# Patient Record
Sex: Male | Born: 1965 | Race: White | Hispanic: No | Marital: Married | Smoking: Never smoker
Health system: Southern US, Community
[De-identification: ages and names within clinical notes are randomized; demographics above are authoritative.]

## PROBLEM LIST (undated history)

## (undated) DIAGNOSIS — K429 Umbilical hernia without obstruction or gangrene: Secondary | ICD-10-CM

## (undated) DIAGNOSIS — R7989 Other specified abnormal findings of blood chemistry: Secondary | ICD-10-CM

## (undated) DIAGNOSIS — J309 Allergic rhinitis, unspecified: Secondary | ICD-10-CM

## (undated) DIAGNOSIS — N529 Male erectile dysfunction, unspecified: Secondary | ICD-10-CM

## (undated) DIAGNOSIS — E785 Hyperlipidemia, unspecified: Secondary | ICD-10-CM

## (undated) DIAGNOSIS — F419 Anxiety disorder, unspecified: Secondary | ICD-10-CM

## (undated) HISTORY — PX: VASECTOMY: SHX75

---

## 2004-07-23 ENCOUNTER — Ambulatory Visit: Payer: Self-pay

## 2016-08-05 ENCOUNTER — Encounter: Payer: Self-pay | Admitting: Anesthesiology

## 2016-08-05 ENCOUNTER — Ambulatory Visit
Admission: RE | Admit: 2016-08-05 | Discharge: 2016-08-05 | Disposition: A | Discharge status: Home / Self Care | Payer: BLUE CROSS/BLUE SHIELD | Source: Ambulatory Visit | Attending: Gastroenterology | Admitting: Gastroenterology

## 2016-08-05 ENCOUNTER — Ambulatory Visit: Payer: BLUE CROSS/BLUE SHIELD | Admitting: Anesthesiology

## 2016-08-05 ENCOUNTER — Encounter
Admission: RE | Disposition: A | Discharge status: Home / Self Care | Payer: Self-pay | Source: Ambulatory Visit | Attending: Gastroenterology

## 2016-08-05 DIAGNOSIS — J309 Allergic rhinitis, unspecified: Secondary | ICD-10-CM | POA: Insufficient documentation

## 2016-08-05 DIAGNOSIS — Z7951 Long term (current) use of inhaled steroids: Secondary | ICD-10-CM | POA: Insufficient documentation

## 2016-08-05 DIAGNOSIS — N529 Male erectile dysfunction, unspecified: Secondary | ICD-10-CM | POA: Diagnosis not present

## 2016-08-05 DIAGNOSIS — Z79899 Other long term (current) drug therapy: Secondary | ICD-10-CM | POA: Insufficient documentation

## 2016-08-05 DIAGNOSIS — K640 First degree hemorrhoids: Secondary | ICD-10-CM | POA: Diagnosis not present

## 2016-08-05 DIAGNOSIS — D127 Benign neoplasm of rectosigmoid junction: Secondary | ICD-10-CM | POA: Insufficient documentation

## 2016-08-05 DIAGNOSIS — Z1211 Encounter for screening for malignant neoplasm of colon: Secondary | ICD-10-CM | POA: Insufficient documentation

## 2016-08-05 DIAGNOSIS — Z791 Long term (current) use of non-steroidal anti-inflammatories (NSAID): Secondary | ICD-10-CM | POA: Diagnosis not present

## 2016-08-05 HISTORY — DX: Other specified abnormal findings of blood chemistry: R79.89

## 2016-08-05 HISTORY — PX: COLONOSCOPY WITH PROPOFOL: SHX5780

## 2016-08-05 HISTORY — DX: Hyperlipidemia, unspecified: E78.5

## 2016-08-05 HISTORY — DX: Male erectile dysfunction, unspecified: N52.9

## 2016-08-05 HISTORY — DX: Anxiety disorder, unspecified: F41.9

## 2016-08-05 HISTORY — DX: Allergic rhinitis, unspecified: J30.9

## 2016-08-05 HISTORY — DX: Umbilical hernia without obstruction or gangrene: K42.9

## 2016-08-05 SURGERY — COLONOSCOPY WITH PROPOFOL
Anesthesia: General

## 2016-08-05 MED ORDER — MIDAZOLAM HCL 5 MG/5ML IJ SOLN
INTRAMUSCULAR | Status: DC | PRN
  Administered 2016-08-05: 1 mg via INTRAVENOUS

## 2016-08-05 MED ORDER — PROPOFOL 10 MG/ML IV BOLUS
INTRAVENOUS | Status: DC | PRN
  Administered 2016-08-05: 80 mg via INTRAVENOUS
  Administered 2016-08-05: 40 mg via INTRAVENOUS

## 2016-08-05 MED ORDER — PROPOFOL 500 MG/50ML IV EMUL
INTRAVENOUS | Status: DC | PRN
  Administered 2016-08-05: 160 ug/kg/min via INTRAVENOUS

## 2016-08-05 MED ORDER — PHENYLEPHRINE HCL 10 MG/ML IJ SOLN
INTRAMUSCULAR | Status: DC | PRN
  Administered 2016-08-05 (×4): 100 ug via INTRAVENOUS

## 2016-08-05 MED ORDER — SODIUM CHLORIDE 0.9 % IV SOLN: INTRAVENOUS | Status: DC

## 2016-08-05 MED ORDER — EPHEDRINE SULFATE 50 MG/ML IJ SOLN
INTRAMUSCULAR | Status: DC | PRN
  Administered 2016-08-05: 10 mg via INTRAVENOUS

## 2016-08-05 MED ORDER — SODIUM CHLORIDE 0.9 % IV SOLN
INTRAVENOUS | Status: DC
  Administered 2016-08-05: 14:00:00 via INTRAVENOUS

## 2016-08-05 MED ORDER — FENTANYL CITRATE (PF) 100 MCG/2ML IJ SOLN
INTRAMUSCULAR | Status: DC | PRN
  Administered 2016-08-05: 50 ug via INTRAVENOUS

## 2016-08-05 MED ORDER — LIDOCAINE 2% (20 MG/ML) 5 ML SYRINGE
INTRAMUSCULAR | Status: DC | PRN
  Administered 2016-08-05: 40 mg via INTRAVENOUS

## 2016-08-05 NOTE — Op Note (Signed)
[facility] Gastroenterology Patient Name: Jesus Warren Procedure Date: 08/05/2016 2:40 PM MRN: [id number] Account #: [id number] Date of Birth: Jan 11, 1966 Admit Type: Outpatient Age: 50 Room: [facility] ENDO ROOM 3 Gender: Male Note Status: Finalized Procedure:            Colonoscopy Indications:          Screening for colorectal malignant neoplasm, This is                        the patient's first colonoscopy Providers:            Lollie Sails, MD Referring MD:         Caprice Renshaw MD (Referring MD) Medicines:            Monitored Anesthesia Care Complications:        No immediate complications. Procedure:            Pre-Anesthesia Assessment:                       - ASA Grade Assessment: II - A patient with mild                        systemic disease.                       After obtaining informed consent, the colonoscope was                        passed under direct vision. Throughout the procedure,                        the patient's blood pressure, pulse, and oxygen                        saturations were monitored continuously. The                        Colonoscope was introduced through the anus and                        advanced to the the cecum, identified by appendiceal                        orifice and ileocecal valve. The colonoscopy was                        performed with moderate difficulty. Successful                        completion of the procedure was aided by changing the                        patient to a supine position and using manual pressure.                        The quality of the bowel preparation was good. Findings:      Two sessile polyps were found in the recto-sigmoid colon. The polyps       were 2 mm in size. These polyps were removed with a cold biopsy forceps.       Resection and  retrieval were complete.      The retroflexed view of the distal rectum and anal verge was normal and       showed no anal or rectal  abnormalities.      The digital rectal exam was normal.      Non-bleeding internal hemorrhoids were found during anoscopy. The       hemorrhoids were small and Grade I (internal hemorrhoids that do not       prolapse). Impression:           - Two 2 mm polyps at the recto-sigmoid colon, removed                        with a cold biopsy forceps. Resected and retrieved.                       - The distal rectum and anal verge are normal on                        retroflexion view.                       - Non-bleeding internal hemorrhoids. Recommendation:       - Await pathology results.                       - Telephone GI clinic for pathology results in 1 week. Procedure Code(s):    --- Professional ---                       [phone], Colonoscopy, flexible; with biopsy, single or                        multiple Diagnosis Code(s):    --- Professional ---                       Z12.11, Encounter for screening for malignant neoplasm                        of colon                       D12.7, Benign neoplasm of rectosigmoid junction                       K64.0, First degree hemorrhoids CPT copyright 2016 American [employer] Association. All rights reserved. The codes documented in this report are preliminary and upon coder review may  be revised to meet current compliance requirements. Lollie Sails, MD 08/05/2016 3:28:08 PM This report has been signed electronically. Number of Addenda: 0 Note Initiated On: 08/05/2016 2:40 PM Scope Withdrawal Time: 0 hours 12 minutes 36 seconds  Total Procedure Duration: 0 hours 32 minutes 11 seconds       [facility]

## 2016-08-05 NOTE — H&P (Signed)
Outpatient short stay form Pre-procedure 08/05/2016 2:31 PM  Lollie Sails MD  Primary Physician: Dr Derinda Late  Reason for visit:  Colonoscopy  History of present illness:  Patient is a 50 year old male presenting today as above. He he had some nausea and emesis with his prep however states he is going clear. He takes no aspirin or blood thinning agents.    Current Facility-Administered Medications:  .  0.9 %  sodium chloride infusion, , Intravenous, Continuous, Lollie Sails, MD .  0.9 %  sodium chloride infusion, , Intravenous, Continuous, Lollie Sails, MD  Prescriptions Prior to Admission  Medication Sig Dispense Refill Last Dose  . cyclobenzaprine (FLEXERIL) 10 MG tablet Take 10 mg by mouth at bedtime.   Past Month at Unknown time  . fluticasone (FLONASE) 50 MCG/ACT nasal spray Place 2 sprays into both nostrils daily.   Past Month at Unknown time  . meloxicam (MOBIC) 15 MG tablet Take 15 mg by mouth daily.   Past Month at Unknown time  . testosterone cypionate (DEPOTESTOTERONE CYPIONATE) 100 MG/ML injection Inject 100 mg into the muscle every 7 (seven) days. For IM use only   08/04/2016 at Unknown time     No Known Allergies   Past Medical History:  Diagnosis Date  . Allergic rhinitis   . Anxiety   . Erectile dysfunction   . Hyperlipidemia   . Low testosterone   . Umbilical hernia     Review of systems:      Physical Exam    Heart and lungs: Regular rate and rhythm without rub or gallop, lungs are bilaterally Clear.    HEENT: Normocephalic atraumatic eyes are anicteric    Other:     Pertinant exam for procedure: Soft nontender nondistended bowel sounds positive normoactive.    Planned proceedures: Colonoscopy and indicated procedures. I have discussed the risks benefits and complications of procedures to include not limited to bleeding, infection, perforation and the risk of sedation and the patient wishes to proceed.    Lollie Sails, MD Gastroenterology 08/05/2016  2:31 PM

## 2016-08-05 NOTE — Transfer of Care (Signed)
Immediate Anesthesia Transfer of Care Note  Patient: Jesus Warren  Procedure(s) Performed: Procedure(s): COLONOSCOPY WITH PROPOFOL (N/A)  Patient Location: PACU and Endoscopy Unit  Anesthesia Type:General  Level of Consciousness: sedated  Airway & Oxygen Therapy: Patient Spontanous Breathing and Patient connected to nasal cannula oxygen  Post-op Assessment: Report given to RN and Post -op Vital signs reviewed and stable  Post vital signs: Reviewed and stable  Last Vitals:  Vitals:   08/05/16 1359  BP: (!) 176/109  Temp: 36.2 C    Last Pain: There were no vitals filed for this visit.       Complications: No apparent anesthesia complications

## 2016-08-05 NOTE — Anesthesia Preprocedure Evaluation (Signed)
Anesthesia Evaluation  Patient identified by MRN, date of birth, ID band Patient awake    Reviewed: Allergy & Precautions, H&P , NPO status , Patient's Chart, lab work & pertinent test results, reviewed documented beta blocker date and time   History of Anesthesia Complications Negative for: history of anesthetic complications  Airway Mallampati: I  TM Distance: >3 FB Neck ROM: full    Dental no notable dental hx.    Pulmonary neg pulmonary ROS,    Pulmonary exam normal breath sounds clear to auscultation       Cardiovascular Exercise Tolerance: Good negative cardio ROS Normal cardiovascular exam Rhythm:regular Rate:Normal     Neuro/Psych negative neurological ROS  negative psych ROS   GI/Hepatic negative GI ROS, Neg liver ROS,   Endo/Other  negative endocrine ROS  Renal/GU negative Renal ROS  negative genitourinary   Musculoskeletal   Abdominal   Peds  Hematology negative hematology ROS (+)   Anesthesia Other Findings Past Medical History: No date: Allergic rhinitis No date: Anxiety No date: Erectile dysfunction No date: Hyperlipidemia No date: Low testosterone No date: Umbilical hernia   Reproductive/Obstetrics negative OB ROS                             Anesthesia Physical Anesthesia Plan  ASA: I  Anesthesia Plan: General   Post-op Pain Management:    Induction:   Airway Management Planned:   Additional Equipment:   Intra-op Plan:   Post-operative Plan:   Informed Consent: I have reviewed the patients History and Physical, chart, labs and discussed the procedure including the risks, benefits and alternatives for the proposed anesthesia with the patient or authorized representative who has indicated his/her understanding and acceptance.   Dental Advisory Given  Plan Discussed with: Anesthesiologist, CRNA and Surgeon  Anesthesia Plan Comments:          Anesthesia Quick Evaluation

## 2016-08-06 ENCOUNTER — Encounter: Payer: Self-pay | Admitting: Gastroenterology

## 2016-08-07 LAB — SURGICAL PATHOLOGY

## 2016-08-07 NOTE — Anesthesia Postprocedure Evaluation (Signed)
Anesthesia Post Note  Patient: Jesus Warren  Procedure(s) Performed: Procedure(s) (LRB): COLONOSCOPY WITH PROPOFOL (N/A)  Patient location during evaluation: PACU Anesthesia Type: General Level of consciousness: awake and alert and oriented Pain management: pain level controlled Vital Signs Assessment: post-procedure vital signs reviewed and stable Respiratory status: spontaneous breathing Cardiovascular status: blood pressure returned to baseline Anesthetic complications: no     Last Vitals:  Vitals:   08/05/16 1532 08/05/16 1542  BP: 90/61 124/79  Pulse: 69   Resp: 13   Temp: (!) 36 C     Last Pain:  Vitals:   08/06/16 0819  TempSrc:   PainSc: 0-No pain                 Jayden Rudge

## 2021-08-24 ENCOUNTER — Inpatient Hospital Stay (HOSPITAL_COMMUNITY)
Admission: EM | Admit: 2021-08-24 | Discharge: 2021-08-27 | DRG: 065 | Disposition: A | Discharge status: Home / Self Care | Payer: 59 | Attending: Neurology | Admitting: Neurology

## 2021-08-24 ENCOUNTER — Emergency Department (HOSPITAL_COMMUNITY): Payer: 59

## 2021-08-24 ENCOUNTER — Other Ambulatory Visit: Payer: Self-pay

## 2021-08-24 ENCOUNTER — Encounter (HOSPITAL_COMMUNITY): Payer: Self-pay | Admitting: Neurology

## 2021-08-24 DIAGNOSIS — I619 Nontraumatic intracerebral hemorrhage, unspecified: Secondary | ICD-10-CM | POA: Diagnosis present

## 2021-08-24 DIAGNOSIS — R29700 NIHSS score 0: Secondary | ICD-10-CM | POA: Diagnosis present

## 2021-08-24 DIAGNOSIS — R471 Dysarthria and anarthria: Secondary | ICD-10-CM | POA: Diagnosis present

## 2021-08-24 DIAGNOSIS — Z20822 Contact with and (suspected) exposure to covid-19: Secondary | ICD-10-CM | POA: Diagnosis present

## 2021-08-24 DIAGNOSIS — E669 Obesity, unspecified: Secondary | ICD-10-CM | POA: Diagnosis present

## 2021-08-24 DIAGNOSIS — Z6830 Body mass index (BMI) 30.0-30.9, adult: Secondary | ICD-10-CM

## 2021-08-24 DIAGNOSIS — I680 Cerebral amyloid angiopathy: Secondary | ICD-10-CM | POA: Diagnosis present

## 2021-08-24 DIAGNOSIS — H547 Unspecified visual loss: Secondary | ICD-10-CM | POA: Diagnosis present

## 2021-08-24 DIAGNOSIS — E854 Organ-limited amyloidosis: Secondary | ICD-10-CM | POA: Diagnosis present

## 2021-08-24 DIAGNOSIS — I611 Nontraumatic intracerebral hemorrhage in hemisphere, cortical: Secondary | ICD-10-CM

## 2021-08-24 DIAGNOSIS — M5412 Radiculopathy, cervical region: Secondary | ICD-10-CM | POA: Diagnosis not present

## 2021-08-24 DIAGNOSIS — F1010 Alcohol abuse, uncomplicated: Secondary | ICD-10-CM | POA: Diagnosis present

## 2021-08-24 DIAGNOSIS — R260 Ataxic gait: Secondary | ICD-10-CM | POA: Diagnosis present

## 2021-08-24 DIAGNOSIS — E785 Hyperlipidemia, unspecified: Secondary | ICD-10-CM | POA: Diagnosis present

## 2021-08-24 DIAGNOSIS — I610 Nontraumatic intracerebral hemorrhage in hemisphere, subcortical: Secondary | ICD-10-CM | POA: Diagnosis present

## 2021-08-24 DIAGNOSIS — I10 Essential (primary) hypertension: Secondary | ICD-10-CM | POA: Diagnosis present

## 2021-08-24 DIAGNOSIS — I161 Hypertensive emergency: Secondary | ICD-10-CM | POA: Diagnosis present

## 2021-08-24 DIAGNOSIS — F17210 Nicotine dependence, cigarettes, uncomplicated: Secondary | ICD-10-CM | POA: Diagnosis present

## 2021-08-24 DIAGNOSIS — F419 Anxiety disorder, unspecified: Secondary | ICD-10-CM | POA: Diagnosis present

## 2021-08-24 DIAGNOSIS — F17200 Nicotine dependence, unspecified, uncomplicated: Secondary | ICD-10-CM | POA: Diagnosis not present

## 2021-08-24 DIAGNOSIS — I6389 Other cerebral infarction: Secondary | ICD-10-CM | POA: Diagnosis not present

## 2021-08-24 LAB — COMPREHENSIVE METABOLIC PANEL
ALT: 80 U/L — ABNORMAL HIGH (ref 0–44)
AST: 62 U/L — ABNORMAL HIGH (ref 15–41)
Albumin: 4 g/dL (ref 3.5–5.0)
Alkaline Phosphatase: 116 U/L (ref 38–126)
Anion gap: 12 (ref 5–15)
BUN: 13 mg/dL (ref 6–20)
CO2: 22 mmol/L (ref 22–32)
Calcium: 8.7 mg/dL — ABNORMAL LOW (ref 8.9–10.3)
Chloride: 106 mmol/L (ref 98–111)
Creatinine, Ser: 1.09 mg/dL (ref 0.61–1.24)
GFR, Estimated: >60 mL/min (ref >60)
Glucose, Bld: 135 mg/dL — ABNORMAL HIGH (ref 70–99)
Potassium: 3.3 mmol/L — ABNORMAL LOW (ref 3.5–5.1)
Sodium: 140 mmol/L (ref 135–145)
Total Bilirubin: 0.8 mg/dL (ref 0.3–1.2)
Total Protein: 7 g/dL (ref 6.5–8.1)

## 2021-08-24 LAB — PROTIME-INR
INR: 1.1 (ref 0.8–1.2)
Prothrombin Time: 14.3 seconds (ref 11.4–15.2)

## 2021-08-24 LAB — I-STAT CHEM 8, ED
BUN: 13 mg/dL (ref 6–20)
Calcium, Ion: 1.04 mmol/L — ABNORMAL LOW (ref 1.15–1.40)
Chloride: 106 mmol/L (ref 98–111)
Creatinine, Ser: 1.3 mg/dL — ABNORMAL HIGH (ref 0.61–1.24)
Glucose, Bld: 137 mg/dL — ABNORMAL HIGH (ref 70–99)
HCT: 44 % (ref 39.0–52.0)
Hemoglobin: 15 g/dL (ref 13.0–17.0)
Potassium: 3.4 mmol/L — ABNORMAL LOW (ref 3.5–5.1)
Sodium: 141 mmol/L (ref 135–145)
TCO2: 22 mmol/L (ref 22–32)

## 2021-08-24 LAB — DIFFERENTIAL
Abs Immature Granulocytes: 0.03 10*3/uL (ref 0.00–0.07)
Basophils Absolute: 0.1 10*3/uL (ref 0.0–0.1)
Basophils Relative: 1 %
Eosinophils Absolute: 0.2 10*3/uL (ref 0.0–0.5)
Eosinophils Relative: 3 %
Immature Granulocytes: 0 %
Lymphocytes Relative: 32 %
Lymphs Abs: 2.8 10*3/uL (ref 0.7–4.0)
Monocytes Absolute: 0.7 10*3/uL (ref 0.1–1.0)
Monocytes Relative: 8 %
Neutro Abs: 5 10*3/uL (ref 1.7–7.7)
Neutrophils Relative %: 56 %

## 2021-08-24 LAB — CBC
HCT: 42.1 % (ref 39.0–52.0)
Hemoglobin: 15 g/dL (ref 13.0–17.0)
MCH: 32.5 pg (ref 26.0–34.0)
MCHC: 35.6 g/dL (ref 30.0–36.0)
MCV: 91.1 fL (ref 80.0–100.0)
Platelets: 125 10*3/uL — ABNORMAL LOW (ref 150–400)
RBC: 4.62 MIL/uL (ref 4.22–5.81)
RDW: 12.1 % (ref 11.5–15.5)
WBC: 8.9 10*3/uL (ref 4.0–10.5)
nRBC: 0 % (ref 0.0–0.2)

## 2021-08-24 LAB — RESP PANEL BY RT-PCR (FLU A&B, COVID) ARPGX2
Influenza A by PCR: NEGATIVE
Influenza B by PCR: NEGATIVE
SARS Coronavirus 2 by RT PCR: NEGATIVE

## 2021-08-24 LAB — ETHANOL: Alcohol, Ethyl (B): 296 mg/dL — ABNORMAL HIGH (ref <10)

## 2021-08-24 LAB — APTT: aPTT: 31 seconds (ref 24–36)

## 2021-08-24 LAB — CBG MONITORING, ED: Glucose-Capillary: 134 mg/dL — ABNORMAL HIGH (ref 70–99)

## 2021-08-24 IMAGING — CT CT HEAD CODE STROKE
3 series · 14 of 47 positions shown, 16 images · non-contrast
Comparison: None.

CLINICAL DATA: Code stroke.

EXAM:
CT HEAD WITHOUT CONTRAST
TECHNIQUE: Contiguous axial images were obtained from the base of the skull
through the vertex without intravenous contrast.

[Series 3: head 5.0 st · axial · 0.49mm/px · z∈[-102,+38]mm · 8 of 34 slices shown, 10 images]
[im 3/34  brain]
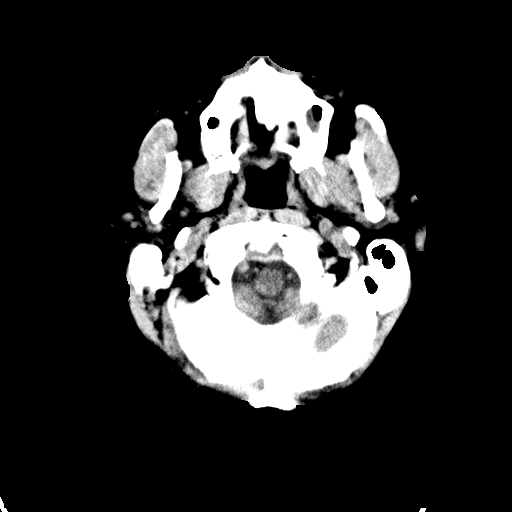
[im 3/34  bone]
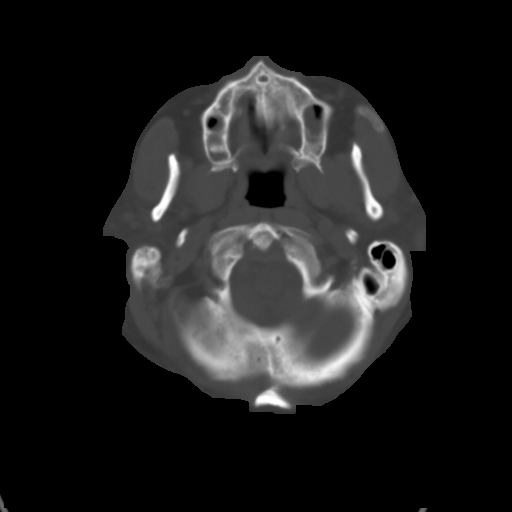
[im 7/34  brain]
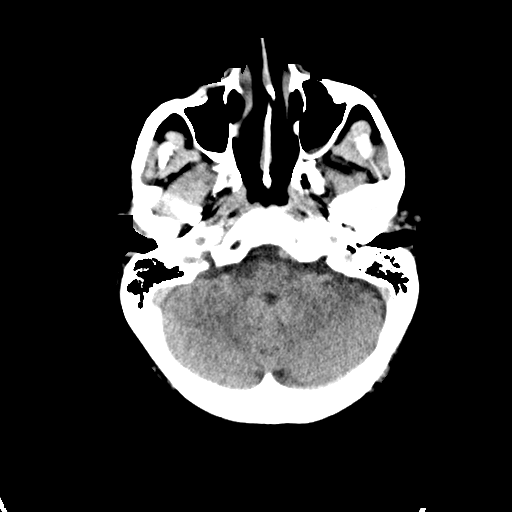
[im 11/34  brain]
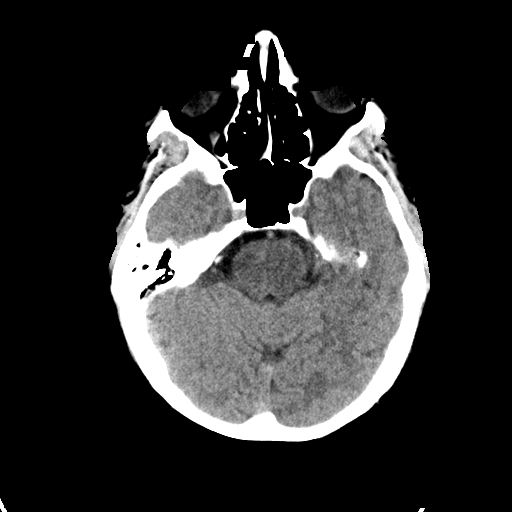
[im 15/34  brain]
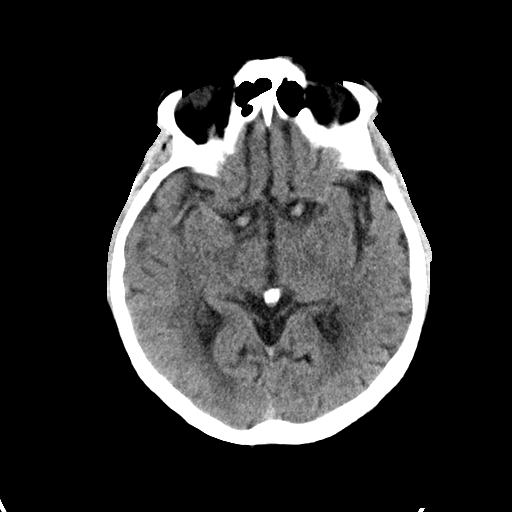
[im 19/34  brain]
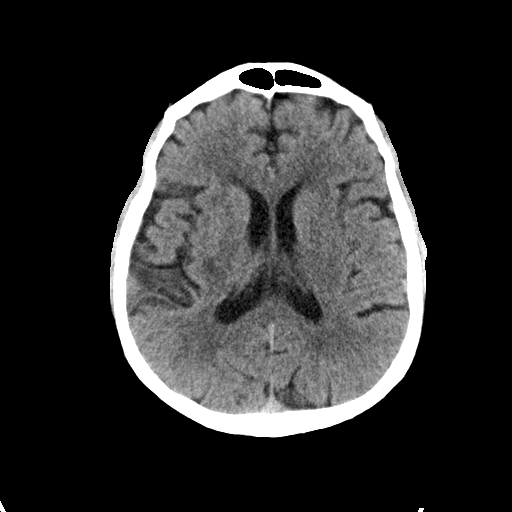
[im 19/34  bone]
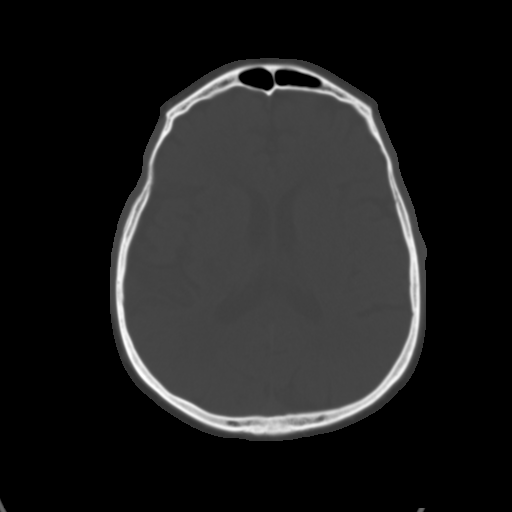
[im 23/34  brain]
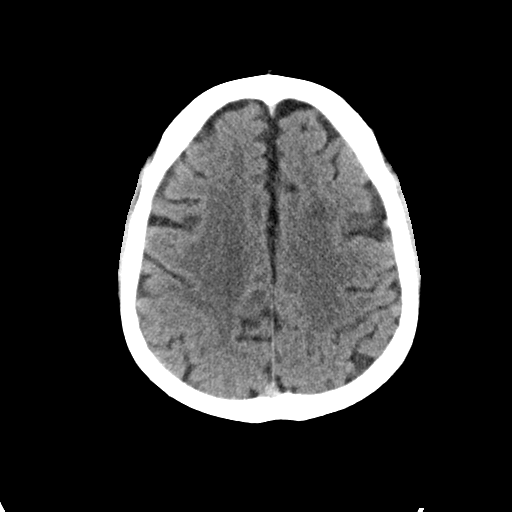
[im 27/34  brain]
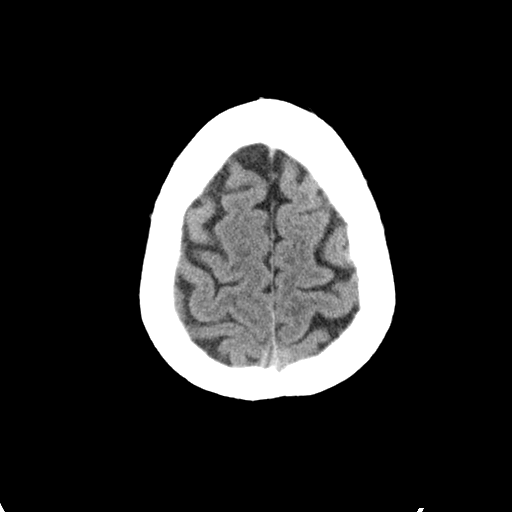
[im 31/34  brain]
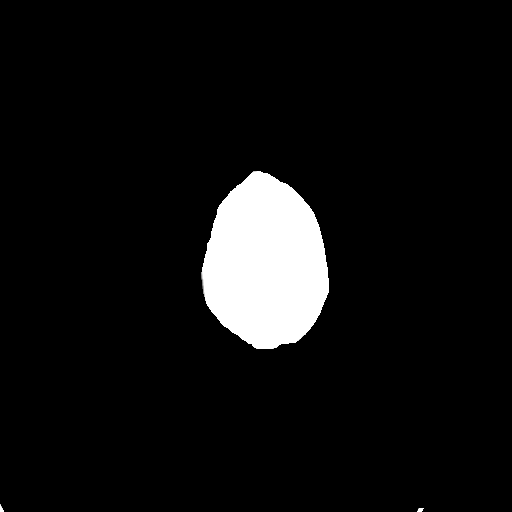

[Series 5: head 3.0 cor st · coronal · 0.33mm/px · 3 of 67 slices shown]
[im 23/67  brain]
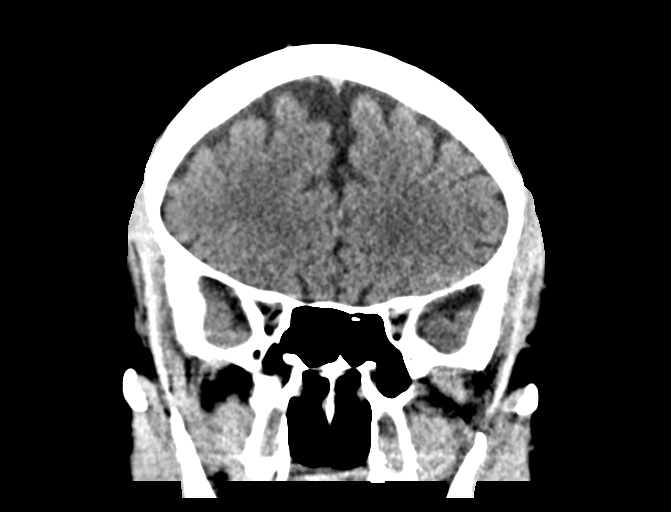
[im 30/67  brain]
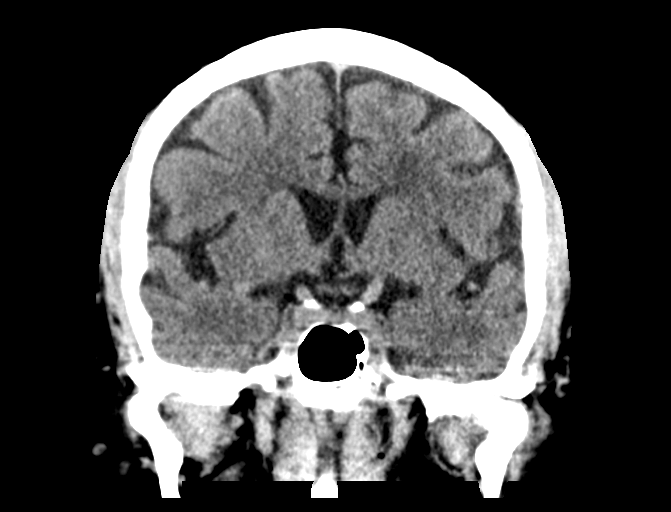
[im 37/67  brain]
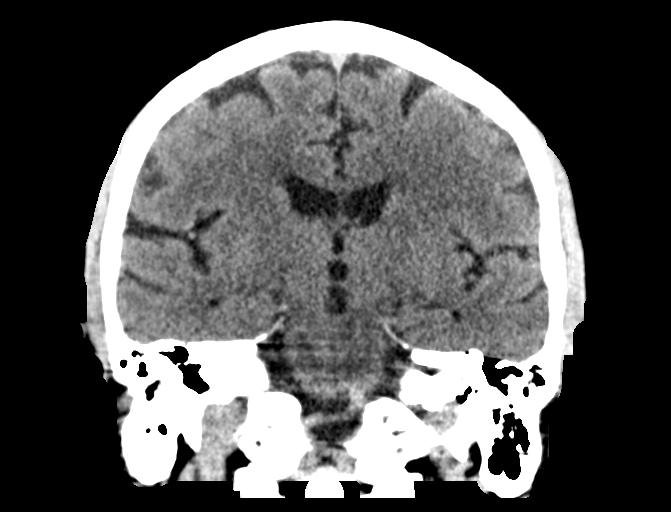

[Series 6: head 3.0 sag st · sagittal · 0.33mm/px · 3 of 67 slices shown]
[im 23/67  brain]
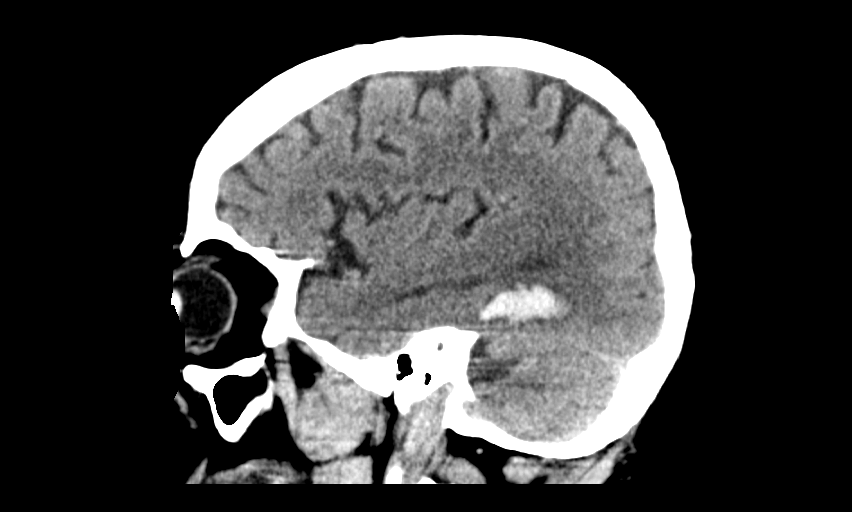
[im 34/67  brain]
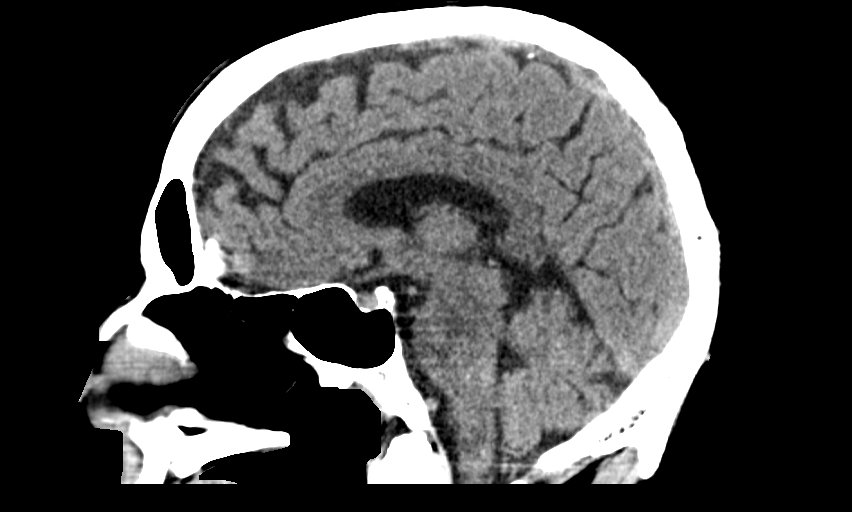
[im 45/67  brain]
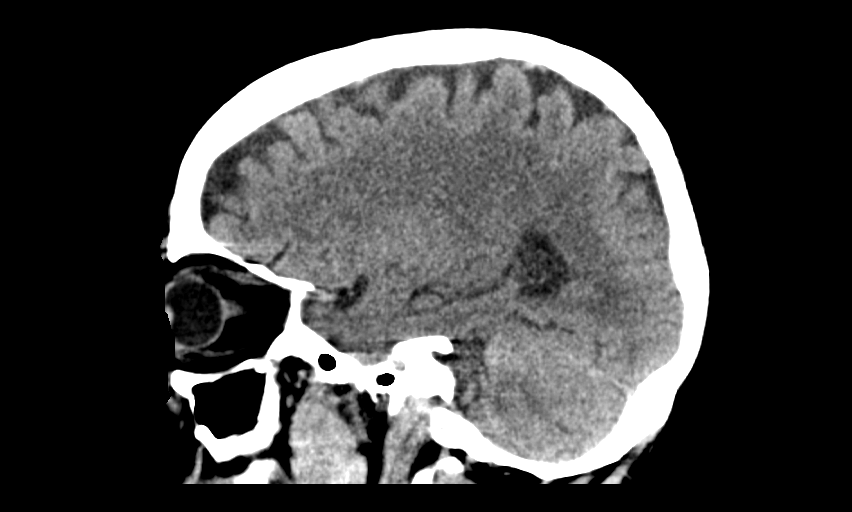

[14 of 47 positions shown; findings below may reference images not displayed]

FINDINGS: Brain: Acute intraparenchymal hemorrhage within the posterior right
temporal lobe measuring 2.6 x 0.7 x 1.0 cm (volume approximately
mL). No intraventricular extension. No mass effect. The size and
configuration of the ventricles and extra-axial CSF spaces are
normal. There is hypoattenuation of the periventricular white
matter, most commonly indicating chronic ischemic microangiopathy.

Vascular: No abnormal hyperdensity of the major intracranial
arteries or dural venous sinuses. No intracranial atherosclerosis.

Skull: The visualized skull base, calvarium and extracranial soft
tissues are normal.

Sinuses/Orbits: No fluid levels or advanced mucosal thickening of
the visualized paranasal sinuses. No mastoid or middle ear effusion.
The orbits are normal.
IMPRESSION: Acute intraparenchymal hemorrhage within the posterior right
temporal lobe measuring 2.6 x 0.7 x 1.0 cm (volume approximately
mL).

Critical Value/emergent results were called by telephone at the time
of interpretation on [DATE] at [DATE] to provider SHOLA
SHOLA, who verbally acknowledged these results.

## 2021-08-24 MED ORDER — ACETAMINOPHEN 325 MG PO TABS
650.0000 mg | ORAL_TABLET | ORAL | Status: DC | PRN
  Administered 2021-08-25 – 2021-08-26 (×3): 650 mg via ORAL
  Filled 2021-08-24 (×3): qty 2

## 2021-08-24 MED ORDER — STROKE: EARLY STAGES OF RECOVERY BOOK
Freq: Once | Status: AC
  Administered 2021-08-24: 1
  Filled 2021-08-24: qty 1

## 2021-08-24 MED ORDER — LABETALOL HCL 5 MG/ML IV SOLN
20.0000 mg | Freq: Once | INTRAVENOUS | Status: AC
  Administered 2021-08-24: 20 mg via INTRAVENOUS

## 2021-08-24 MED ORDER — SENNOSIDES-DOCUSATE SODIUM 8.6-50 MG PO TABS
1.0000 | ORAL_TABLET | Freq: Two times a day (BID) | ORAL | Status: DC
  Administered 2021-08-26 – 2021-08-27 (×2): 1 via ORAL
  Filled 2021-08-24 (×4): qty 1

## 2021-08-24 MED ORDER — ACETAMINOPHEN 160 MG/5ML PO SOLN: 650.0000 mg | ORAL | Status: DC | PRN

## 2021-08-24 MED ORDER — CLEVIDIPINE BUTYRATE 0.5 MG/ML IV EMUL
0.0000 mg/h | INTRAVENOUS | Status: DC
  Administered 2021-08-24: 2 mg/h via INTRAVENOUS
  Administered 2021-08-25: 16 mg/h via INTRAVENOUS
  Administered 2021-08-25: 4 mg/h via INTRAVENOUS
  Administered 2021-08-25: 6 mg/h via INTRAVENOUS
  Administered 2021-08-25 (×2): 2 mg/h via INTRAVENOUS
  Filled 2021-08-24 (×5): qty 50

## 2021-08-24 MED ORDER — ACETAMINOPHEN 650 MG RE SUPP: 650.0000 mg | RECTAL | Status: DC | PRN

## 2021-08-24 MED ORDER — PANTOPRAZOLE SODIUM 40 MG IV SOLR
40.0000 mg | Freq: Every day | INTRAVENOUS | Status: DC
  Administered 2021-08-24: 40 mg via INTRAVENOUS
  Filled 2021-08-24: qty 40

## 2021-08-24 MED ORDER — SODIUM CHLORIDE 0.9% FLUSH
3.0000 mL | Freq: Once | INTRAVENOUS | Status: AC
  Administered 2021-08-24: 3 mL via INTRAVENOUS

## 2021-08-24 NOTE — H&P (Signed)
NEUROLOGY H and P NOTE   Date of service: August 24, 2021 Patient Name: Jesus Warren MRN:  [id number] DOB:  [birth year] _ _ _   _ __   _ __ _ _  __ __   _ __   __ _  History of Present Illness  Jesus Warren is a 56 y.o. male with PMH significant for erectile dysfunction, hyperlipidemia, low testosterone, colon who presents with 3 day history of headaches and since 0900 on 08/24/21, has had confusion with staggering gait, slurred speech.  Family called EMS and he was brought in as a stroke code. 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  On evaluation, appears bradyphrenic, easily agitated, somewhat confused but no focal neuro deficit. He reports that since this morning, the left side of his vision has been acting funny. He has some squiggles in there almost like he is looking at a video game. Patient is an unreliable historian.  Wife reports that she got home from work at [employer] today and found him just confused and he was trying to tell himself that he got this. His speech was slurry and he reported vision issues. He has been taking Ibuprofen every few hours for his headache.  He used to drink alcohol everyday until christmas. Used to drink about 4 shots of vodka. Also sniffs tobacco daily because that gives him a nicotine high. Has been struggling with a lot of mental issues but took himself off the meds and has not followed up with his PCP since 2017.  ICH score: 0 LKW: 0900 on 08/24/21 mRS: 0 tNKASE/Thrombectomy: not offered 2/2 ICH NIHSS components Score: Comment  1a Level of Conscious 0[x]  1[]  2[]  3[]      1b LOC Questions 0[x]  1[]  2[]       1c LOC Commands 0[x]  1[]  2[]       2 Best Gaze 0[x]  1[]  2[]       3 Visual 0[x]  1[]  2[]  3[]      4 Facial Palsy 0[x]  1[]  2[]  3[]      5a Motor Arm - left 0[x]  1[]  2[]  3[]  4[]  UN[]    5b Motor Arm - Right 0[x]  1[]  2[]  3[]  4[]  UN[]    6a Motor Leg - Left 0[x]  1[]  2[]  3[]  4[]  UN[]    6b Motor Leg - Right  0[x]  1[]  2[]  3[]  4[]  UN[]    7 Limb Ataxia 0[x]  1[]  2[]  3[]  UN[]     8 Sensory 0[x]  1[]  2[]  UN[]      9 Best Language 0[x]  1[]  2[]  3[]      10 Dysarthria 0[]  1[x]  2[]  UN[]      11 Extinct. and Inattention 0[x]  1[]  2[]       TOTAL: 0     ROS   Unable to obtain detailed review of system due to agitation and confusion.  Patient denies any headache at this time but does endorse headache in the back of his head for the last couple days.  Past History   Past Medical History:  Diagnosis Date   Allergic rhinitis    Anxiety    Erectile dysfunction    Hyperlipidemia    Low testosterone    Umbilical hernia    Past Surgical History:  Procedure Laterality Date   COLONOSCOPY WITH PROPOFOL N/A 08/05/2016   Procedure: COLONOSCOPY WITH PROPOFOL;  Surgeon: Lollie Sails, MD;  Location: [facility] ENDOSCOPY;  Service: Endoscopy;  Laterality: N/A;   VASECTOMY     No family history on file. Social History  Socioeconomic History   Marital status: Married    Spouse name: Not on file   Number of children: Not on file   Years of education: Not on file   Highest education level: Not on file  Occupational History   Not on file  Tobacco Use   Smoking status: Not on file   Smokeless tobacco: Not on file  Substance and Sexual Activity   Alcohol use: Not on file   Drug use: Not on file   Sexual activity: Not on file  Other Topics Concern   Not on file  Social History Narrative   Not on file   Social Determinants of Health   Financial Resource Strain: Not on file  Food Insecurity: Not on file  Transportation Needs: Not on file  Physical Activity: Not on file  Stress: Not on file  Social Connections: Not on file   No Known Allergies  Medications  (Not in a hospital admission)    Vitals   Vitals:   08/24/21 2005 08/24/21 2026  BP: (!) 195/114   Pulse: (!) 103   Resp: 19   Temp:  98.3 F (36.8 C)  TempSrc:  Oral  SpO2: 98%      There is no height or weight on  file to calculate BMI.  Physical Exam   General: Laying comfortably in bed; easily agitated, requires frequent redirection. HENT: Normal oropharynx and mucosa. Normal external appearance of ears and nose.  Neck: Supple, no pain or tenderness  CV: No JVD. No peripheral edema.  Pulmonary: Symmetric Chest rise. Normal respiratory effort.  Abdomen: Soft to touch, non-tender.  Ext: No cyanosis, edema, or deformity  Skin: No rash. Normal palpation of skin.   Musculoskeletal: Normal digits and nails by inspection. No clubbing.   Neurologic Examination  Mental status/Cognition: Alert, oriented to self, place, month and year, poor attention. Easily loses his train of thoughts and has very poor insight into his condition. Keeps telling me this has been going on since half a decade despite telling him that the bleeding is new. Speech/language: Fluent, comprehension intact, object naming intact, repetition intact.  Cranial nerves:   CN II Pupils equal and reactive to light, no VF deficits    CN III,IV,VI EOM intact, no gaze preference or deviation, no nystagmus    CN V normal sensation in V1, V2, and V3 segments bilaterally    CN VII no asymmetry, no nasolabial fold flattening    CN VIII normal hearing to speech    CN IX & X normal palatal elevation, no uvular deviation    CN XI 5/5 head turn and 5/5 shoulder shrug bilaterally    CN XII midline tongue protrusion    Motor:  Muscle bulk: normal, tone normal, pronator drift none tremor none Mvmt Root Nerve  Muscle Right Left Comments  SA C5/6 Ax Deltoid 5 5   EF C5/6 Mc Biceps 5 5   EE C6/7/8 Rad Triceps 5 5   WF C6/7 Med FCR     WE C7/8 PIN ECU     F Ab C8/T1 U ADM/FDI 5 5   HF L1/2/3 Fem Illopsoas 5 5   KE L2/3/4 Fem Quad 5 5   DF L4/5 D Peron Tib Ant 5 5   PF S1/2 Tibial Grc/Sol 5 5    Reflexes:  Right Left Comments  Pectoralis      Biceps (C5/6) 2 2   Brachioradialis (C5/6) 2 2    Triceps (C6/7) 2 2  Patellar (L3/4) 2 2     Achilles (S1)      Hoffman      Plantar     Jaw jerk    Sensation:  Light touch Intact throughout   Pin prick    Temperature    Vibration   Proprioception    Coordination/Complex Motor:  - Finger to Nose intact BL - Heel to shin intact BL - Rapid alternating movement are normal - Gait: Deferred for patient safety.  Labs   CBC:  Recent Labs  Lab 08/24/21 2007 08/24/21 2008  WBC 8.9  --   NEUTROABS 5.0  --   HGB 15.0 15.0  HCT 42.1 44.0  MCV 91.1  --   PLT 125*  --     Basic Metabolic Panel:  Lab Results  Component Value Date   NA 141 08/24/2021   K 3.4 (L) 08/24/2021   GLUCOSE 137 (H) 08/24/2021   BUN 13 08/24/2021   CREATININE 1.30 (H) 08/24/2021   Lipid Panel: No results found for: LDLCALC HgbA1c: No results found for: HGBA1C Urine Drug Screen: No results found for: LABOPIA, COCAINSCRNUR, LABBENZ, AMPHETMU, THCU, LABBARB  Alcohol Level No results found for: [place]  CT Head without contrast(Personally reviewed): Small acute IPH in the posterior R temporal lobe.  MR Angio head without contrast and Carotid Duplex BL: pending  MRI Brain: pending   Impression   Jesus Warren is a 56 y.o. male with PMH significant for erectile dysfunction, hyperlipidemia, low testosterone, colon who presents with 3 day history of headaches and since 0900 on 08/24/21, confusion since this AM along with staggering gait, slurred speech and left vision deficit. His neurologic examination is notable for slurred speech along with executive dysfunction and poor insight.  Found to have a small R posterior temporal ICH on CTH.  Primary Diagnosis:  Non traumatic subcortical Intracranial hemorrhage Hypertensive emergency  Secondary Diagnosis: Hypertension Emergency (SBP > 180 or DBP > 120 & end organ damage) and Obesity  Recommendations   R posterior Temporal Non-traumatic subcortical ICH: - Admit to ICU - Stability scan in 6 hours or STAT with any neurological decline -  Frequent neuro checks; q57min for 1 hour, then q1hour - No antiplatelets or anticoagulants due to [place] - SCD for DVT prophylaxis, pharmacological DVT ppx at 24 hours if ICH is stable - Blood pressure control with goal systolic 409 - 811, cleverplex and labetalol PRN - Stroke labs, HgbA1c, fasting lipid panel - MRI brain with and without contrast when stabilized to evaluate for underlying mass - MRA without contrast of the brain and Vasc US carotid duplex to evaluate for underlying vascular abnormality. - Risk factor modification - Echocardiogram - PT consult, OT consult, Speech consult. - Discussed with patient and wife and requested not to taking any Ibupforen going forward. - Stroke team to follow   Hypertensive Emergency: - Labetalol and Cleviprex with SBP goal 120-140.  Anxiety: - Will closely monitor. Took himself off medications.   ______________________________________________________________________  This patient is critically ill and at significant risk of neurological worsening, death and care requires constant monitoring of vital signs, hemodynamics,respiratory and cardiac monitoring, neurological assessment, discussion with family, other specialists and medical decision making of high complexity. I spent 40 minutes of neurocritical care time  in the care of  this patient. This was time spent independent of any time provided by nurse practitioner or PA.  Donnetta Simpers [facility] Pager Number [phone] 08/24/2021  9:22 PM   Thank you for the  opportunity to take part in the care of this patient. If you have any further questions, please contact the neurology consultation attending.  Signed,  [place] Pager Number [phone] _ _ _   _ __   _ __ _ _  __ __   _ __   __ _

## 2021-08-24 NOTE — ED Triage Notes (Addendum)
Pt BIB [facility] from home after wife noticed husband having slurred speech, difficulty walking, visual changes and c/o headache. LKN at 0900 this morning.

## 2021-08-24 NOTE — ED Provider Notes (Signed)
[facility] EMERGENCY DEPARTMENT Provider Note   CSN: [id number] Arrival date & time: 08/24/21  2001  An emergency department physician performed an initial assessment on this suspected stroke patient at 2003.  History  Chief Complaint  Patient presents with   Code Stroke    Jesus Warren is a 56 y.o. male.  56 y.o male with a PMH of Hyperlipidemia presents to the ED via EMS for slurred speech and unsteady gate. According to wife Jesus Warren, she reports patient reported a severe headache, did take medication to help with the symptoms.  He also noted to have slurred speech, he tried to ambulate however had a very unsteady gait.  According to wife, patient recently quit drinking alcohol, was originally drinking 4 glasses of vodka every other day.  She does and having a fall, however they did not seek medical treatment until now.  Is not anticoagulated. No recent sick contacts or illness.   The history is provided by the patient and medical records.      Home Medications Prior to Admission medications   Medication Sig Start Date End Date Taking? Authorizing Provider  acetaminophen (TYLENOL) 500 MG tablet Take 1,000 mg by mouth every 6 (six) hours as needed for moderate pain or headache.   Yes [provider]  ibuprofen (ADVIL) 200 MG tablet Take 400-600 mg by mouth every 6 (six) hours as needed for headache or moderate pain.   Yes [provider]      Allergies    Patient has no known allergies.    Review of Systems   Review of Systems  Unable to perform ROS: Acuity of condition  Constitutional:  Negative for chills and fever.   Physical Exam Updated Vital Signs BP 128/87    Pulse 67    Temp 98.3 F (36.8 C) (Oral)    Resp (!) 24    Ht 6\' 2"  (1.88 m)    Wt 106.1 kg    SpO2 96%    BMI 30.04 kg/m  Physical Exam Vitals and nursing note reviewed.  Constitutional:      Appearance: He is diaphoretic.  HENT:     Head: Normocephalic and atraumatic.      Nose: Nose normal.     Mouth/Throat:     Mouth: Mucous membranes are dry.  Eyes:     Pupils: Pupils are equal, round, and reactive to light.  Cardiovascular:     Rate and Rhythm: Normal rate.  Pulmonary:     Effort: Pulmonary effort is normal.  Abdominal:     General: Abdomen is flat.     Palpations: Abdomen is soft.  Musculoskeletal:     Cervical back: Normal range of motion and neck supple.  Neurological:     Mental Status: He is alert.  Psychiatric:        Mood and Affect: Affect is inappropriate.    ED Results / Procedures / Treatments   Labs (all labs ordered are listed, but only abnormal results are displayed) Labs Reviewed  CBC - Abnormal; Notable for the following components:      Result Value   Platelets 125 (*)    All other components within normal limits  COMPREHENSIVE METABOLIC PANEL - Abnormal; Notable for the following components:   Potassium 3.3 (*)    Glucose, Bld 135 (*)    Calcium 8.7 (*)    AST 62 (*)    ALT 80 (*)    All other components within normal limits  ETHANOL - Abnormal; Notable for the following components:   Alcohol, Ethyl (B) 296 (*)    All other components within normal limits  CBG MONITORING, ED - Abnormal; Notable for the following components:   Glucose-Capillary 134 (*)    All other components within normal limits  I-STAT CHEM 8, ED - Abnormal; Notable for the following components:   Potassium 3.4 (*)    Creatinine, Ser 1.30 (*)    Glucose, Bld 137 (*)    Calcium, Ion 1.04 (*)    All other components within normal limits  RESP PANEL BY RT-PCR (FLU A&B, COVID) ARPGX2  PROTIME-INR  APTT  DIFFERENTIAL  RAPID URINE DRUG SCREEN, HOSP PERFORMED  HIV ANTIBODY (ROUTINE TESTING W REFLEX)  CBG MONITORING, ED    EKG None  Radiology CT HEAD CODE STROKE WO CONTRAST  Result Date: 08/24/2021 CLINICAL DATA:  Code stroke. EXAM: CT HEAD WITHOUT CONTRAST TECHNIQUE: Contiguous axial images were obtained from the base of the skull  through the vertex without intravenous contrast. COMPARISON:  None. FINDINGS: Brain: Acute intraparenchymal hemorrhage within the posterior right temporal lobe measuring 2.6 x 0.7 x 1.0 cm (volume approximately 0.9 mL). No intraventricular extension. No mass effect. The size and configuration of the ventricles and extra-axial CSF spaces are normal. There is hypoattenuation of the periventricular white matter, most commonly indicating chronic ischemic microangiopathy. Vascular: No abnormal hyperdensity of the major intracranial arteries or dural venous sinuses. No intracranial atherosclerosis. Skull: The visualized skull base, calvarium and extracranial soft tissues are normal. Sinuses/Orbits: No fluid levels or advanced mucosal thickening of the visualized paranasal sinuses. No mastoid or middle ear effusion. The orbits are normal. IMPRESSION: Acute intraparenchymal hemorrhage within the posterior right temporal lobe measuring 2.6 x 0.7 x 1.0 cm (volume approximately 0.9 mL). Critical Value/emergent results were called by telephone at the time of interpretation on 08/24/2021 at 8:25 pm to provider [facility], who verbally acknowledged these results. Electronically Signed   By: Ulyses Jarred M.D.   On: 08/24/2021 20:26    Procedures Procedures    Medications Ordered in ED Medications  acetaminophen (TYLENOL) tablet 650 mg (has no administration in time range)    Or  acetaminophen (TYLENOL) 160 MG/5ML solution 650 mg (has no administration in time range)    Or  acetaminophen (TYLENOL) suppository 650 mg (has no administration in time range)  senna-docusate (Senokot-S) tablet 1 tablet (1 tablet Oral Not Given 08/24/21 2128)  pantoprazole (PROTONIX) injection 40 mg (40 mg Intravenous Given 08/24/21 2129)  labetalol (NORMODYNE) injection 20 mg (20 mg Intravenous Given 08/24/21 2013)    And  clevidipine (CLEVIPREX) infusion 0.5 mg/mL (2 mg/hr Intravenous Rate/Dose Change 08/24/21 2041)  sodium chloride  flush (NS) 0.9 % injection 3 mL (3 mLs Intravenous Given 08/24/21 2027)   stroke: mapping our early stages of recovery book (1 each Does not apply Given 08/24/21 2127)    ED Course/ Medical Decision Making/ A&P Clinical Course as of 08/24/21 2310  Sat Aug 24, 2021  2309 Alcohol, Ethyl (B)(!): 296 [JS]    Clinical Course User Index [JS] Janeece Fitting, PA-C                           Medical Decision Making  Patient presents to the ED with chief complaint of headache, headache began around 9 AM this morning, had some confusion and slurred speech, staggering gait per wife Jesus Warren at the bedside.  He arrived via EMS and was brought  in as a code stroke.  CT was obtained upon arrival which showed   CT Head code stroke showed:  Acute intraparenchymal hemorrhage within the posterior right  temporal lobe measuring 2.6 x 0.7 x 1.0 cm (volume approximately 0.9  mL).     Critical Value/emergent results were called by telephone at the time  of interpretation on 08/24/2021 at 8:25 pm to provider [facility], who verbally acknowledged these results.      During my evaluation patient has an appropriate affect, is responding to questions however seems somewhat agitated, diaphoretic blood pressure is remarkable for systolics in the 425Z but is able to move all upper and lower extremities, speech does seem appropriate to me without any slurred.  According to wife, he did alcohol use approximately 2 weeks ago after Christmas.  She was originally drinking 4 glasses of vodka every other day per wife however has not alcohol in 2 weeks.  Patient is alert and oriented x4, although this does make some inappropriate remarks. Discussed with Dr. Lorrin Goodell who will admit patient for further management after CT findings.  Labs have been reviewed by me.  CMP with slight decrease in potassium.  Level is elevated at 296, patient did have a fall prior to arrival in the ED per wife.  CBC without any leukocytosis.  CBG  slightly elevated.  PT/INR within normal limits, he is not anticoagulated.  The hall level is 296, although patient denies having any alcohol use for the past 2 weeks.  Patient will be admitted to neurology service at this time for further management of his acute intraparenchymal hemorrhage.   Portions of this note were generated with [employer]. Dictation errors may occur despite best attempts at proofreading.  Final Clinical Impression(s) / ED Diagnoses Final diagnoses:  Intraparenchymal hemorrhage of brain [facility])    Rx / DC Orders ED Discharge Orders     None         Janeece Fitting, PA-C 08/24/21 2310    Drenda Freeze, MD 08/25/21 1501

## 2021-08-25 ENCOUNTER — Inpatient Hospital Stay (HOSPITAL_COMMUNITY): Payer: 59

## 2021-08-25 DIAGNOSIS — I6389 Other cerebral infarction: Secondary | ICD-10-CM | POA: Diagnosis not present

## 2021-08-25 DIAGNOSIS — I680 Cerebral amyloid angiopathy: Secondary | ICD-10-CM

## 2021-08-25 DIAGNOSIS — I611 Nontraumatic intracerebral hemorrhage in hemisphere, cortical: Secondary | ICD-10-CM

## 2021-08-25 DIAGNOSIS — I161 Hypertensive emergency: Secondary | ICD-10-CM

## 2021-08-25 DIAGNOSIS — M5412 Radiculopathy, cervical region: Secondary | ICD-10-CM

## 2021-08-25 LAB — BASIC METABOLIC PANEL
Anion gap: 15 (ref 5–15)
BUN: 10 mg/dL (ref 6–20)
CO2: 22 mmol/L (ref 22–32)
Calcium: 9.2 mg/dL (ref 8.9–10.3)
Chloride: 104 mmol/L (ref 98–111)
Creatinine, Ser: 1.06 mg/dL (ref 0.61–1.24)
GFR, Estimated: >60 mL/min (ref >60)
Glucose, Bld: 168 mg/dL — ABNORMAL HIGH (ref 70–99)
Potassium: 3.2 mmol/L — ABNORMAL LOW (ref 3.5–5.1)
Sodium: 141 mmol/L (ref 135–145)

## 2021-08-25 LAB — RAPID URINE DRUG SCREEN, HOSP PERFORMED
Amphetamines: NOT DETECTED
Barbiturates: NOT DETECTED
Benzodiazepines: NOT DETECTED
Cocaine: NOT DETECTED
Opiates: NOT DETECTED
Tetrahydrocannabinol: NOT DETECTED

## 2021-08-25 LAB — ECHOCARDIOGRAM COMPLETE
AR max vel: 3.83 cm2
AV Area VTI: 3.91 cm2
AV Area mean vel: 3.77 cm2
AV Mean grad: 6 mmHg
AV Peak grad: 10.2 mmHg
Ao pk vel: 1.6 m/s
Area-P 1/2: 3.77 cm2
Height: 74 in
S' Lateral: 4.1 cm
Weight: 3744 oz

## 2021-08-25 LAB — LIPID PANEL
Cholesterol: 209 mg/dL — ABNORMAL HIGH (ref 0–200)
HDL: 54 mg/dL (ref >40)
LDL Cholesterol: 95 mg/dL (ref 0–99)
Total CHOL/HDL Ratio: 3.9 RATIO
Triglycerides: 298 mg/dL — ABNORMAL HIGH (ref <150)
VLDL: 60 mg/dL — ABNORMAL HIGH (ref 0–40)

## 2021-08-25 LAB — CBC
HCT: 42.2 % (ref 39.0–52.0)
Hemoglobin: 15.4 g/dL (ref 13.0–17.0)
MCH: 32.8 pg (ref 26.0–34.0)
MCHC: 36.5 g/dL — ABNORMAL HIGH (ref 30.0–36.0)
MCV: 90 fL (ref 80.0–100.0)
Platelets: 153 10*3/uL (ref 150–400)
RBC: 4.69 MIL/uL (ref 4.22–5.81)
RDW: 12.2 % (ref 11.5–15.5)
WBC: 9.3 10*3/uL (ref 4.0–10.5)
nRBC: 0 % (ref 0.0–0.2)

## 2021-08-25 LAB — HIV ANTIBODY (ROUTINE TESTING W REFLEX): HIV Screen 4th Generation wRfx: NONREACTIVE

## 2021-08-25 LAB — MRSA NEXT GEN BY PCR, NASAL: MRSA by PCR Next Gen: NOT DETECTED

## 2021-08-25 LAB — HEMOGLOBIN A1C
Hgb A1c MFr Bld: 5.4 % (ref 4.8–5.6)
Mean Plasma Glucose: 108.28 mg/dL

## 2021-08-25 IMAGING — CT CT HEAD W/O CM
4 series · 16 of 47 positions shown, 18 images · non-contrast
Comparison: Head CT earlier today

CLINICAL DATA: Hemorrhagic stroke.

EXAM:
CT HEAD WITHOUT CONTRAST
TECHNIQUE: Contiguous axial images were obtained from the base of the skull
through the vertex without intravenous contrast.

[Series 3: head without · axial · non-contrast · 0.44mm/px · z∈[+337,+457]mm · 7 of 32 slices shown, 9 images]
[im 4/32  brain]
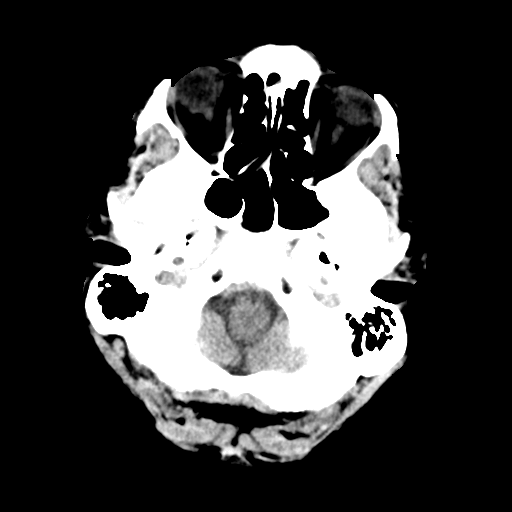
[im 4/32  bone]
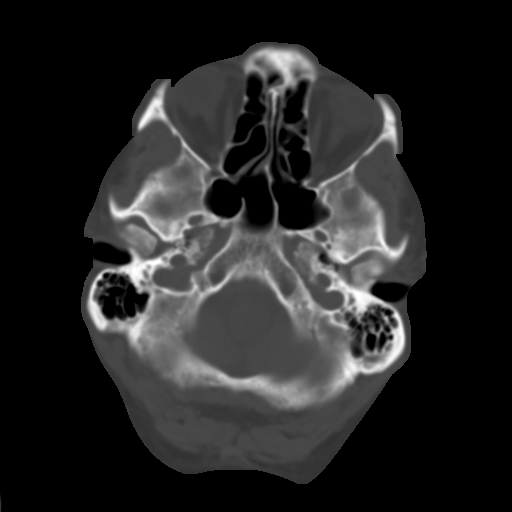
[im 8/32  brain]
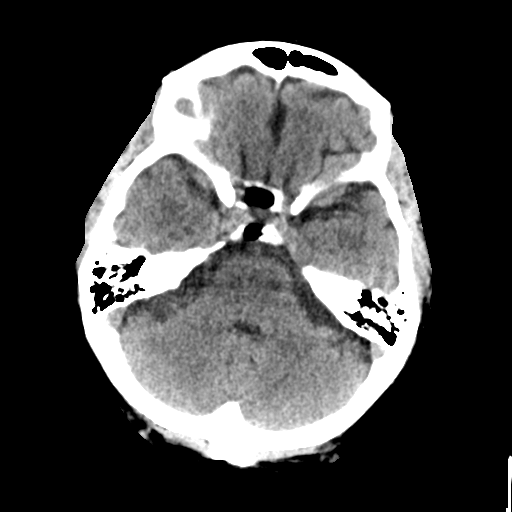
[im 12/32  brain]
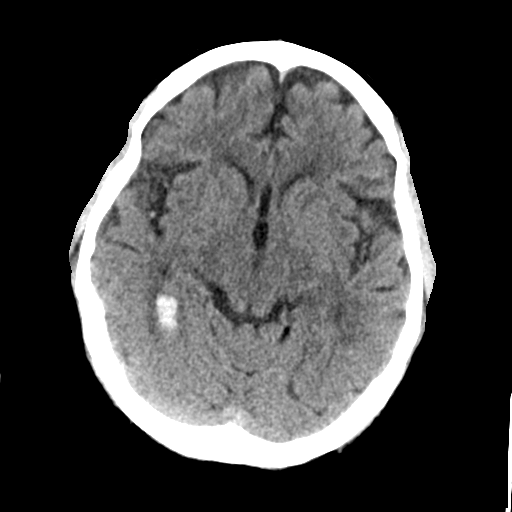
[im 16/32  brain]
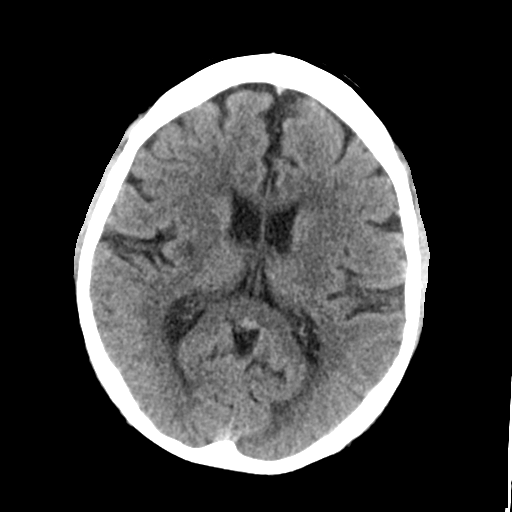
[im 20/32  brain]
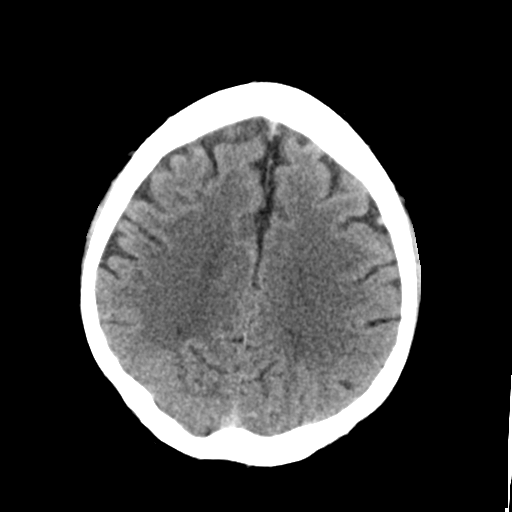
[im 20/32  bone]
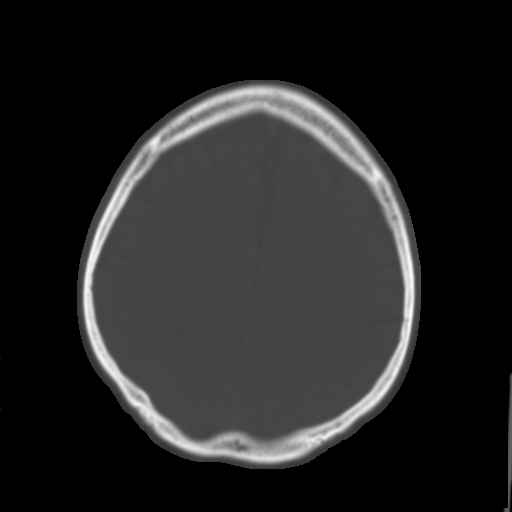
[im 24/32  brain]
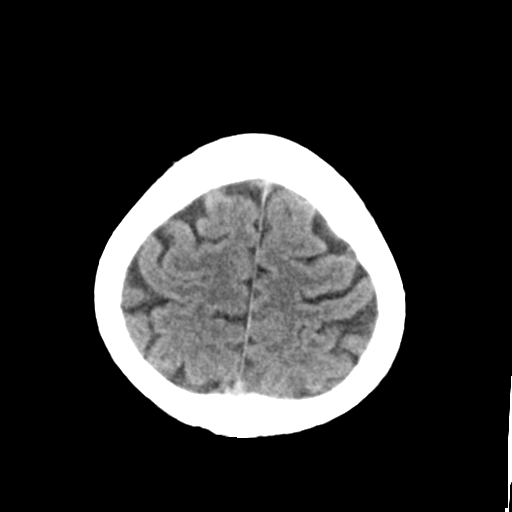
[im 28/32  brain]
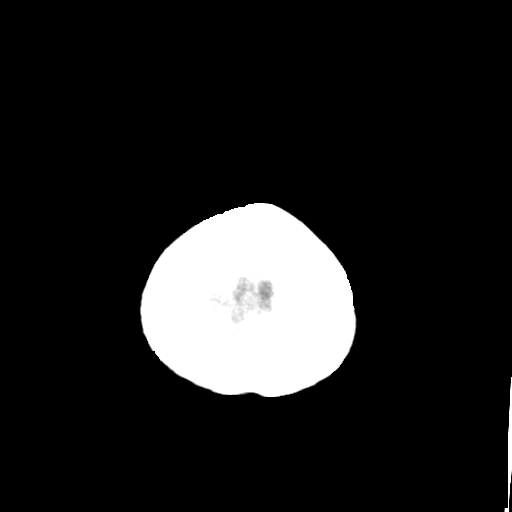

[Series 4: head bone · axial · 0.44mm/px · z∈[+336,+368]mm · 3 of 79 slices shown]
[im 8/79  bone]
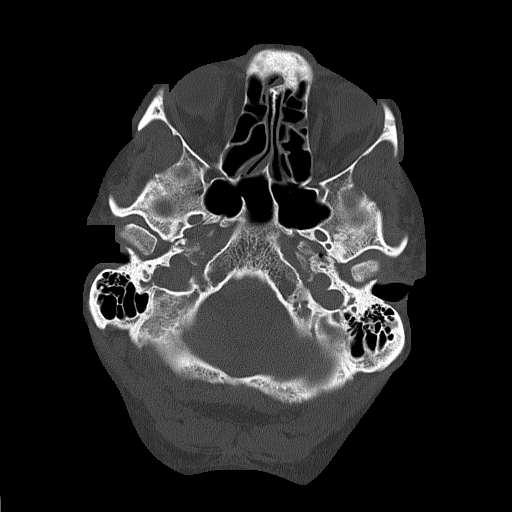
[im 16/79  bone]
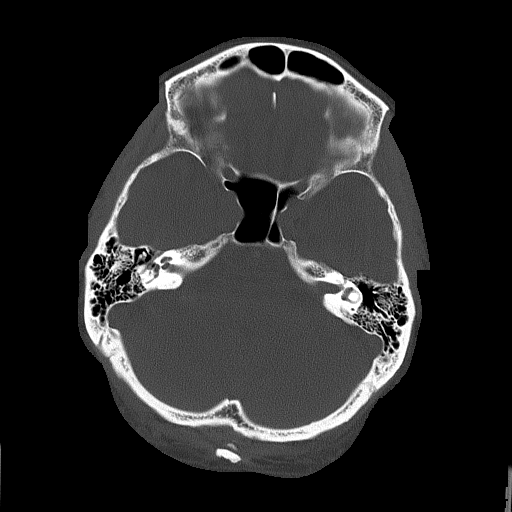
[im 24/79  bone]
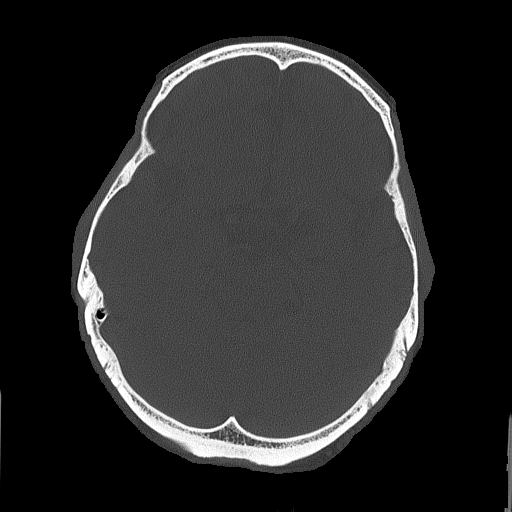

[Series 5: head without cor · coronal · non-contrast · 0.32mm/px · 3 of 67 slices shown]
[im 23/67  brain]
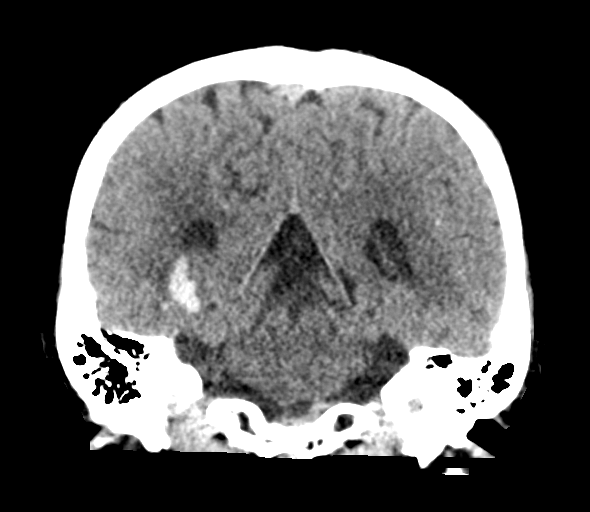
[im 30/67  brain]
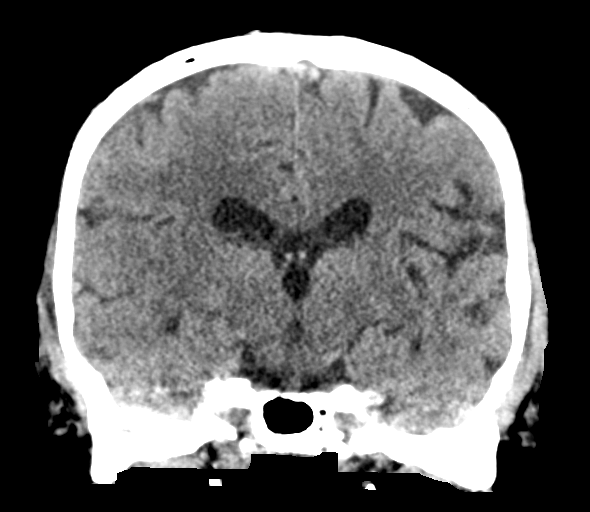
[im 37/67  brain]
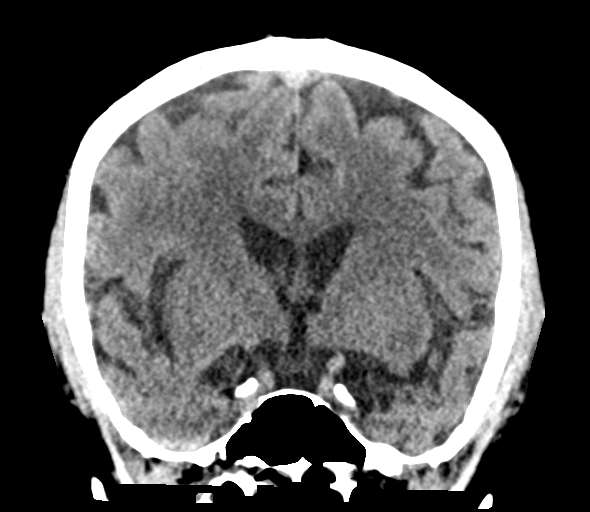

[Series 6: head without sag · sagittal · non-contrast · 0.32mm/px · 3 of 67 slices shown]
[im 23/67  brain]
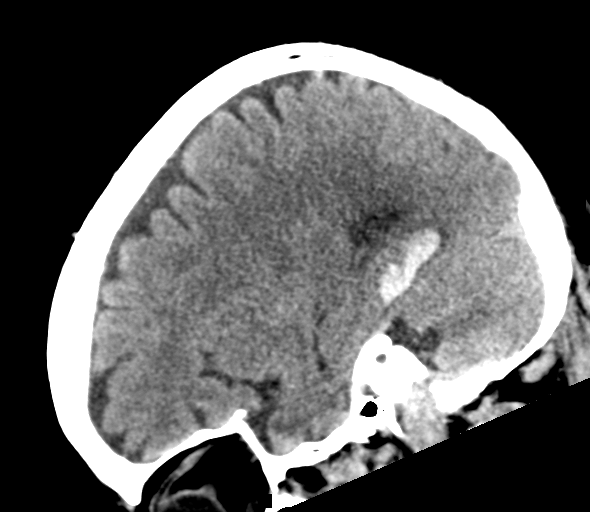
[im 34/67  brain]
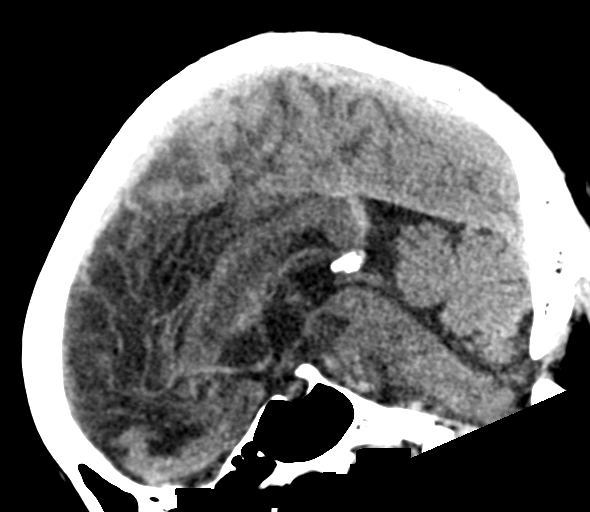
[im 45/67  brain]
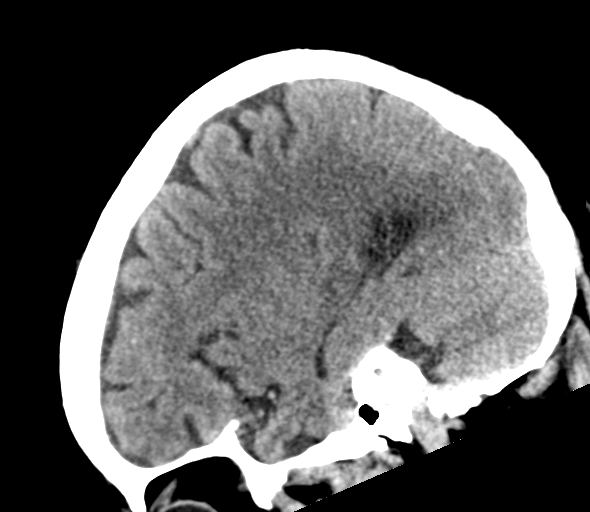

[16 of 47 positions shown; findings below may reference images not displayed]

FINDINGS: Brain: Right temporal lobe periventricular hemorrhage measures 2.4 x
0.9 x 1.8 cm (volume = 2 cm^3), minimally increased in volume from
prior exam, although subjectively not significantly increased in
size. Minimal surrounding edema. No new areas of hemorrhage. Stable
brain volume, no hydrocephalus. Again seen periventricular chronic
small vessel ischemia.

Vascular: Atherosclerosis of skullbase vasculature without
hyperdense vessel or abnormal calcification.

Skull: No fracture or focal lesion.

Sinuses/Orbits: Mucosal thickening throughout the paranasal sinuses.
Mastoid air cells are clear. No acute orbital findings.

Other: None.
IMPRESSION: 1. Right temporal lobe hemorrhage is subjectively not significantly
increased in size. Minimal surrounding edema. No new areas of
hemorrhage.
2. No new hemorrhage.

## 2021-08-25 IMAGING — MR MR HEAD WO/W CM
13 of 16 series · 36 of 48 positions shown · IV contrast (Gad[facility]ist)
Comparison: Head CTs [DATE] and [DATE].

CLINICAL DATA: 55-year-old male with neurologic deficit. Right
temporal lobe hemorrhage.

EXAM:
MRI HEAD WITHOUT CONTRAST AND WITH
MRA HEAD WITHOUT CONTRAST
TECHNIQUE: Multiplanar, multi-echo pulse sequences of the brain and surrounding
structures were acquired without and with intravenous contrast.
Angiographic images of the Circle of Willis were acquired using MRA
technique without intravenous contrast.

[Series 5: ax dwi_tracew · axial · 3.0mm · 1.44mm/px · z∈[-48,+106]mm · 5 of 84 slices shown]
[im 1/84]
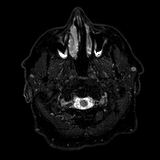
[im 21/84]
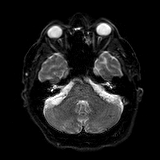
[im 42/84]
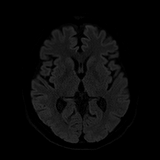
[im 63/84]
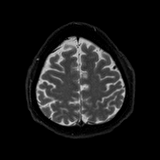
[im 84/84]
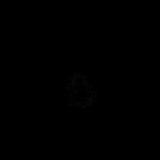

[Series 6: ax dwi_adc · axial · 3.0mm · 1.44mm/px · z∈[-48,+106]mm · 2 of 42 slices shown]
[im 1/42]
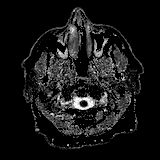
[im 42/42]
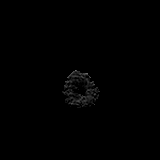

[Series 7: cor dwi_tracew · coronal · 5.0mm · 1.44mm/px · 5 of 72 slices shown]
[im 1/72]
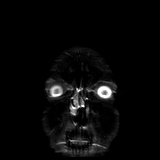
[im 18/72]
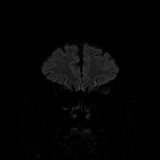
[im 36/72]
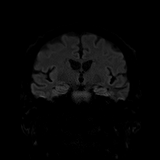
[im 54/72]
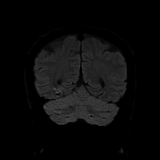
[im 72/72]
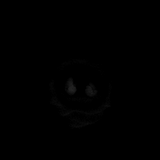

[Series 8: cor dwi_adc · coronal · 5.0mm · 1.44mm/px · 2 of 36 slices shown]
[im 1/36]
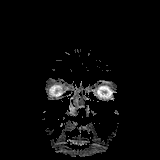
[im 36/36]
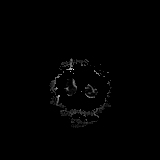

[Series 9: T1 · sagittal · 5.0mm · 0.72mm/px · 2 of 27 slices shown]
[im 1/27]
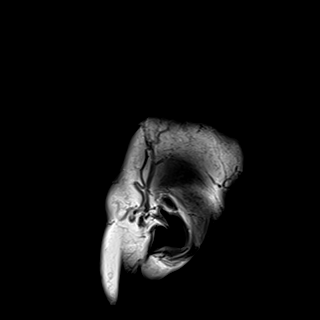
[im 27/27]
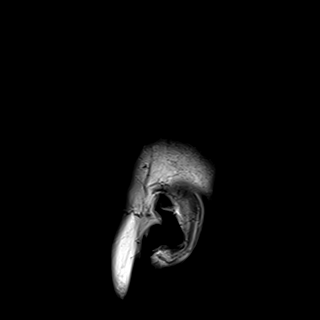

[Series 10: T2 · axial · 5.0mm · 0.75mm/px · z∈[-72,+82]mm · 2 of 27 slices shown (1 of 2)]
[im 1/27]
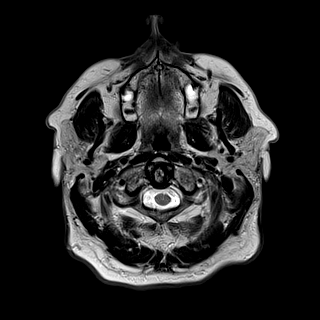
[im 27/27]
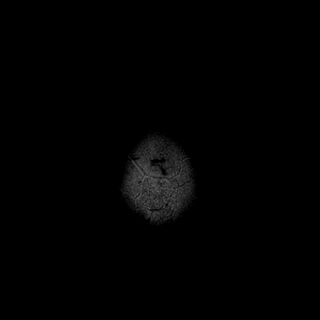

[Series 11: swi_images · axial · 3.0mm · 0.94mm/px · z∈[-91,+84]mm · 4 of 60 slices shown]
[im 1/60]
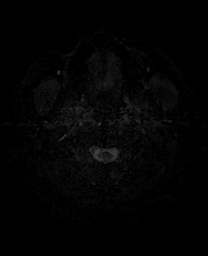
[im 20/60]
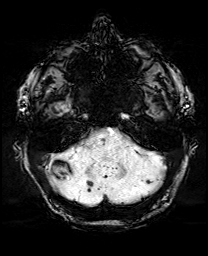
[im 40/60]
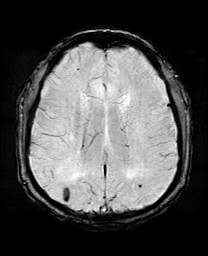
[im 60/60]
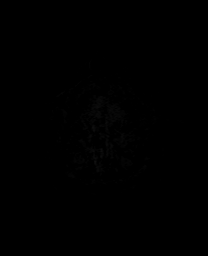

[Series 12: mip_images(sw) · axial · 24.0mm · 0.94mm/px · z∈[-81,+74]mm · 4 of 53 slices shown]
[im 1/53]
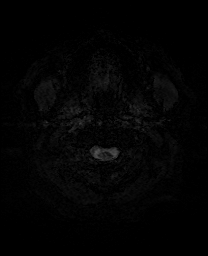
[im 18/53]
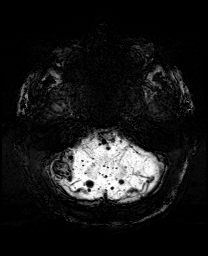
[im 35/53]
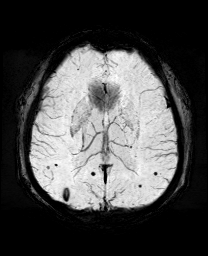
[im 53/53]
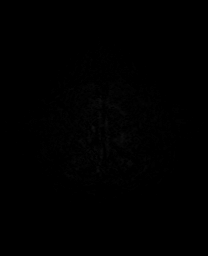

[Series 13: FLAIR · axial · 3.0mm · 0.47mm/px · z∈[-72,+82]mm · 2 of 27 slices shown]
[im 1/27]
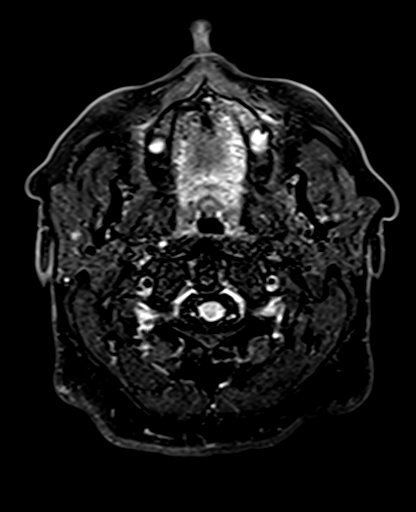
[im 27/27]
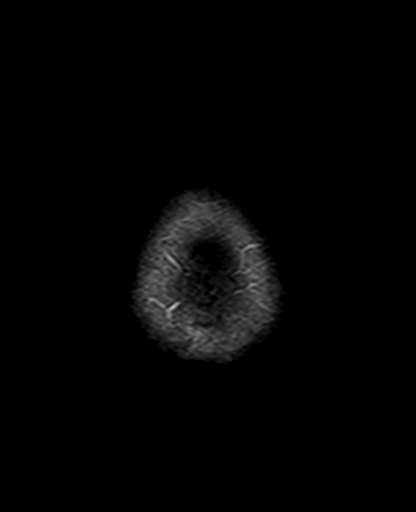

[Series 15: T2 · coronal · 5.0mm · 0.72mm/px · 2 of 31 slices shown (2 of 2)]
[im 1/31]
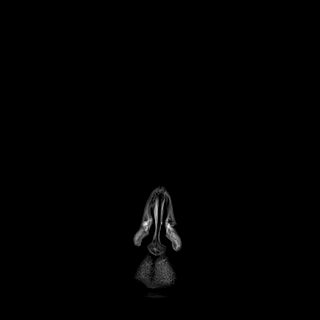
[im 31/31]
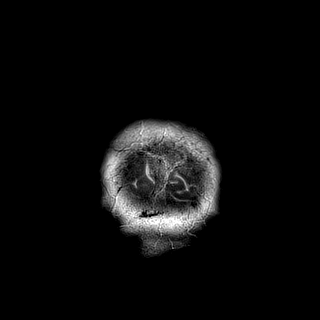

[Series 17: T2 post-contrast · coronal · 5.0mm · 0.72mm/px · 2 of 28 slices shown]
[im 1/28]
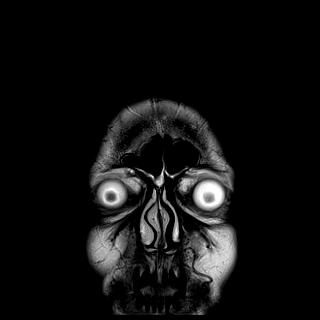
[im 28/28]
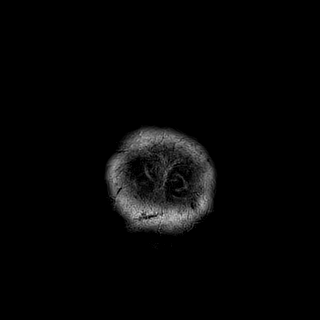

[Series 19: T1 post-contrast · coronal · 5.0mm · 0.34mm/px · 2 of 29 slices shown (1 of 2)]
[im 1/29]
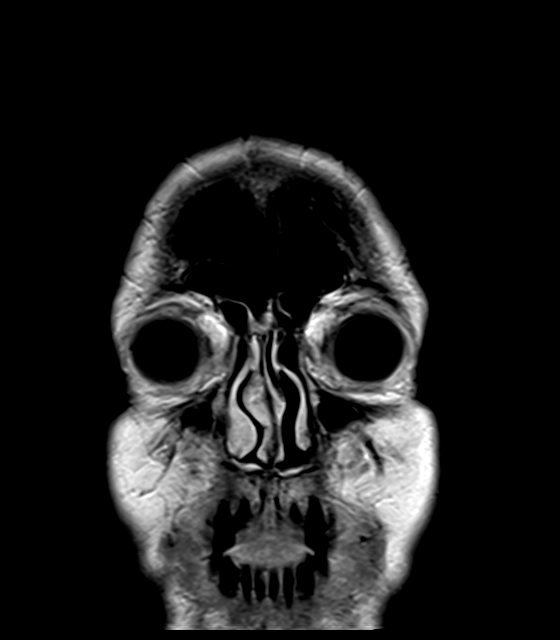
[im 29/29]
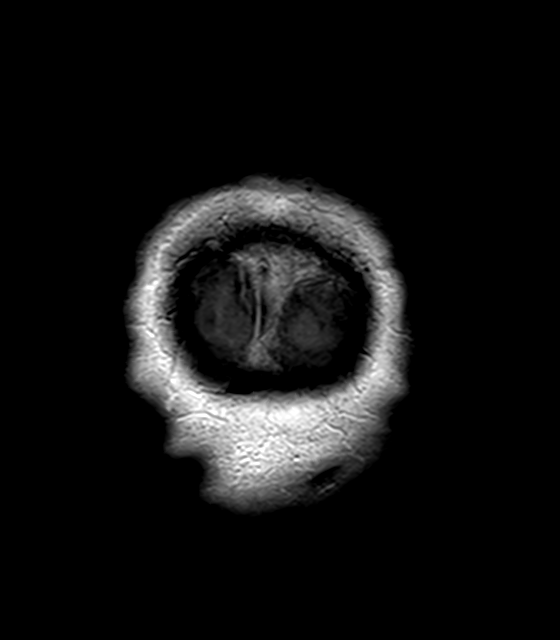

[Series 20: T1 post-contrast · sagittal · 5.0mm · 0.72mm/px · 2 of 25 slices shown (2 of 2)]
[im 1/25]
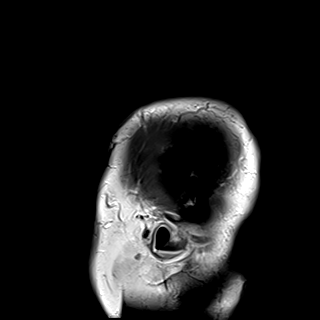
[im 25/25]
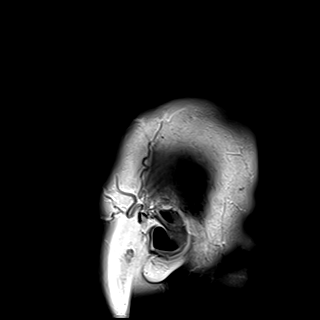

[36 of 48 positions shown; findings below may reference images not displayed]

FINDINGS: MRI HEAD FINDINGS

Brain: T1 isointense mix signal intensity T2 intra-axial hemorrhage
in the right temporal lobe fusiform gyrus encompasses about 30 x 11
x 14 mm (AP by transverse by CC) for an estimated blood volume of
2-3 mL. Mild regional edema. No significant regional mass effect. No
abnormal enhancement identified.

No intraventricular or extra-axial extension. No intraventricular
blood or debris identified. Susceptibility artifact on DWI. No
larger area of restricted diffusion.

Numerous chronic microhemorrhages in the brain, most severe
throughout the brainstem and cerebellum. But multiple hemispheric
foci of chronic hemosiderin also, including involving a 13 mm area
at the junction of the right posterior parietal and occipital lobes
on series 11, image 40. And contralateral posterior left temporal
lobe patchy edema or encephalomalacia also appears associated with
chronic hemosiderin on the SWI (although susceptibility artifact
affects detail).

No superimposed midline shift, mass effect, evidence of mass lesion,
ventriculomegaly, extra-axial collection. Cervicomedullary junction
and pituitary are within normal limits. No abnormal enhancement
identified. No dural thickening. Chronic lacunar infarct of the
posterior right external capsule and moderate for age other
scattered bilateral white matter T2 and FLAIR hyperintensity.
Moderate T2 and FLAIR heterogeneity throughout the brainstem.

Vascular: Major intracranial vascular flow voids are preserved.
Distal right vertebral artery appears dominant. The major dural
venous sinuses are enhancing and appear to be patent.

Skull and upper cervical spine: Negative visible cervical spine.
Visualized bone marrow signal is within normal limits.

Sinuses/Orbits: Negative orbits. Mild to moderate paranasal sinus
mucosal thickening.

Other: Mastoids are clear. Visible internal auditory structures
appear normal.

MRA HEAD FINDINGS

Anterior circulation: Antegrade flow in both ICA siphons. Mild
bilateral ICA tortuosity. No ICA stenosis. Normal left posterior
communicating artery and ophthalmic artery origins. Patent carotid
termini. Normal MCA and ACA origins. Mildly dominant right ACA A1.
Normal anterior communicating artery. Visible bilateral MCA and ACA
branches are within normal limits.

Posterior circulation: Antegrade flow in the posterior circulation
with dominant distal right vertebral artery. Patent vertebrobasilar
junction. Mild basilar artery irregularity. No distal vertebral or
basilar stenosis. SCA and right PCA origins are normal. Fetal type
left PCA origin. Diminutive or absent right posterior communicating
artery. Bilateral PCA branches are within normal limits.

No abnormal flow signal about the right temporal lobe hemorrhage.

Anatomic variants: Fetal type left PCA origin. Dominant right
vertebral artery and right ACA A1.
IMPRESSION: 1. Small acute intra-axial hemorrhage in the right temporal lobe
fusiform gyrus, estimated blood volume 2-3 mL. Mild regional edema
with no mass effect. No abnormal enhancement, extra-axial extension,
or other complicating features.

2. Numerous chronic microhemorrhages in the brain.
These are most severe in the brainstem and cerebellum, but also
scattered in the cerebral hemispheres with evidence of a previous
right parieto-occipital hemorrhage.
And there may be mild left temporal lobe gyral edema associated with
some of the chronic blood products.
Advanced for age but comparatively minor other evidence of chronic
small vessel disease.

3. This constellation is suspicious for Amyloid Angiopathy, despite
the relatively young age group. Is there history of renal failure or
other risk factor for amyloidosis? Otherwise assume advanced chronic
hypertension or other advanced small vessel disease.

4. Negative intracranial MRA.

## 2021-08-25 IMAGING — CT CT HEAD W/O CM
4 series · 15 of 47 positions shown, 17 images · non-contrast
Comparison: [DATE]

CLINICAL DATA: Follow-up bleed

EXAM:
CT HEAD WITHOUT CONTRAST
TECHNIQUE: Contiguous axial images were obtained from the base of the skull
through the vertex without intravenous contrast.

[Series 3: head without · axial · non-contrast · 0.48mm/px · z∈[-60,+60]mm · 7 of 34 slices shown, 9 images]
[im 5/34  brain]
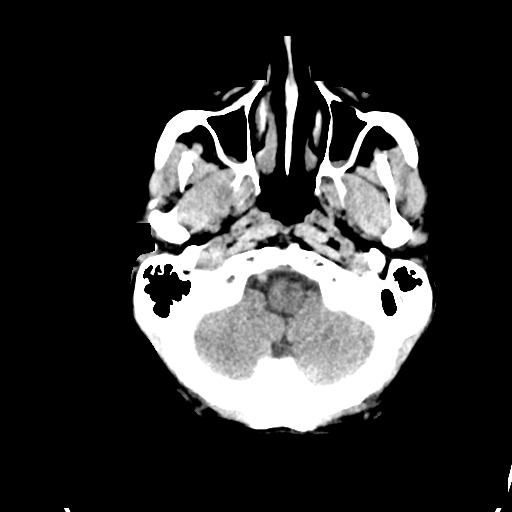
[im 5/34  bone]
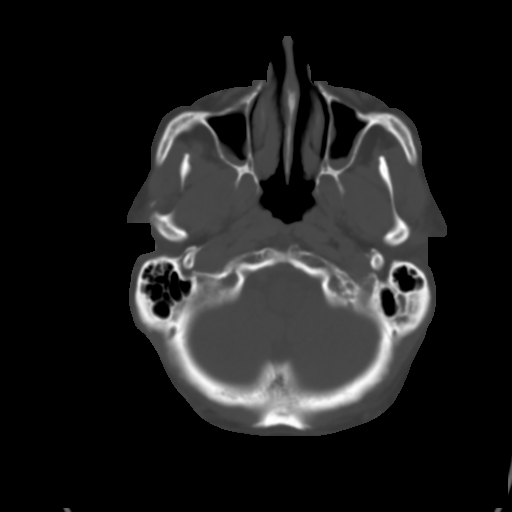
[im 9/34  brain]
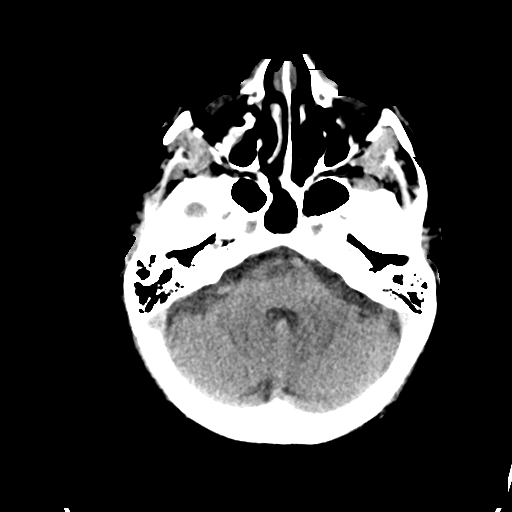
[im 13/34  brain]
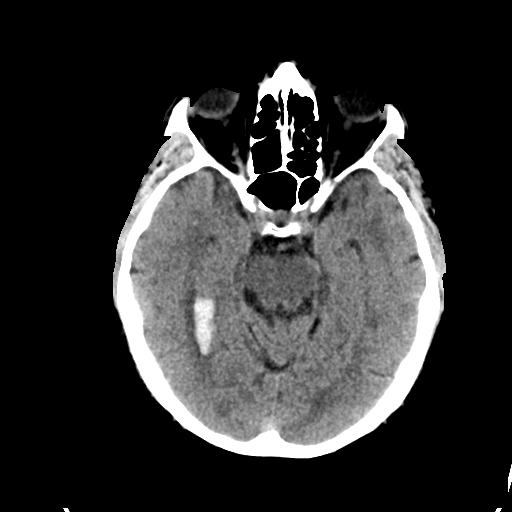
[im 17/34  brain]
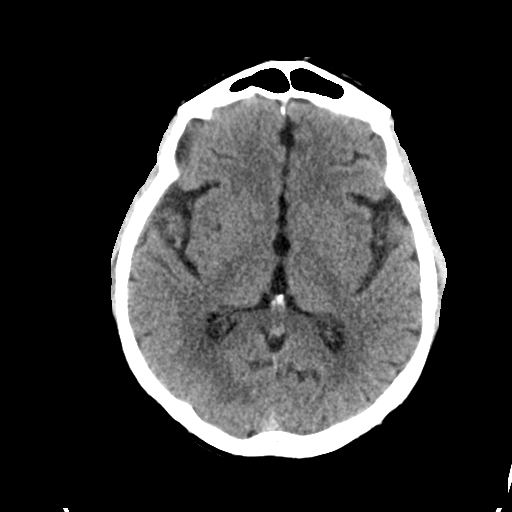
[im 21/34  brain]
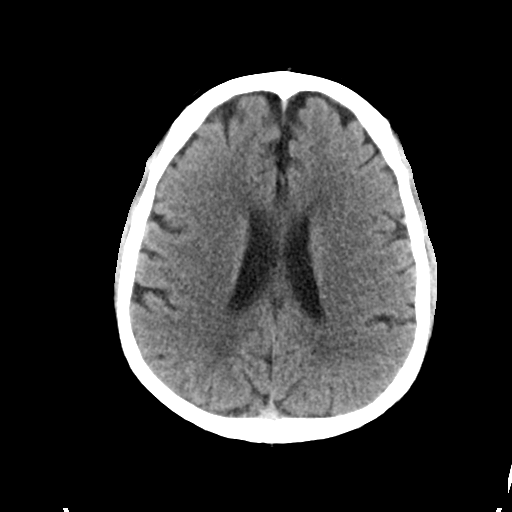
[im 21/34  bone]
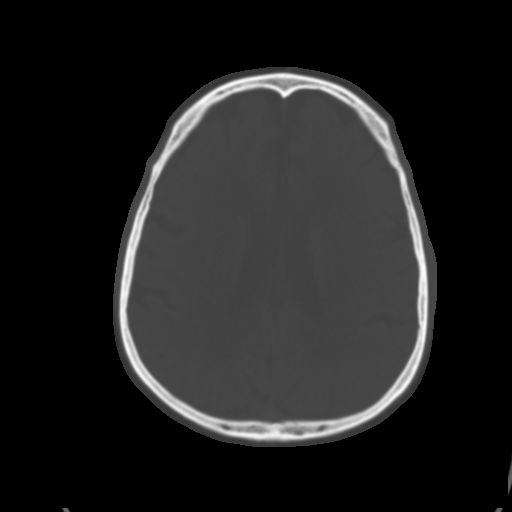
[im 25/34  brain]
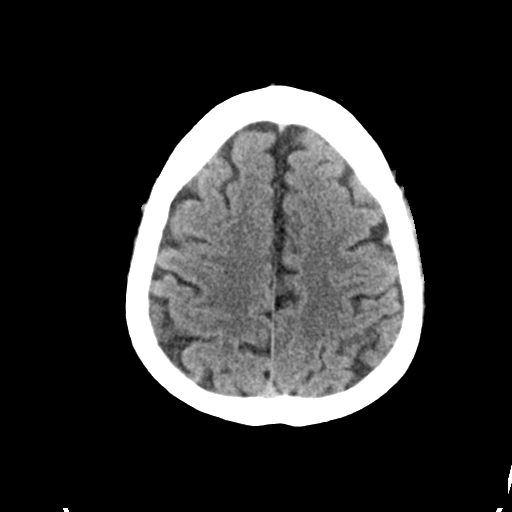
[im 29/34  brain]
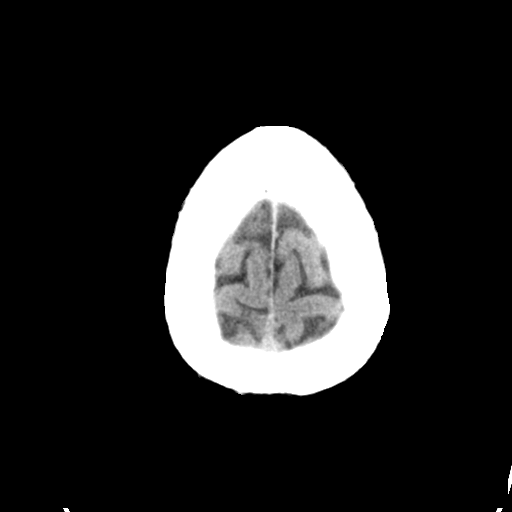

[Series 4: head bone · axial · 0.48mm/px · z∈[-64,-48]mm · 2 of 85 slices shown]
[im 9/85  bone]
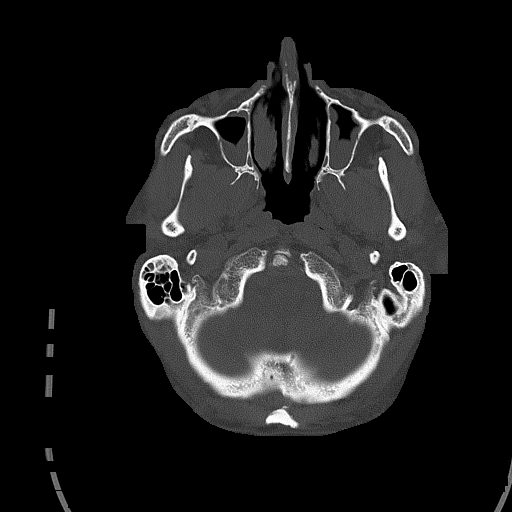
[im 17/85  bone]
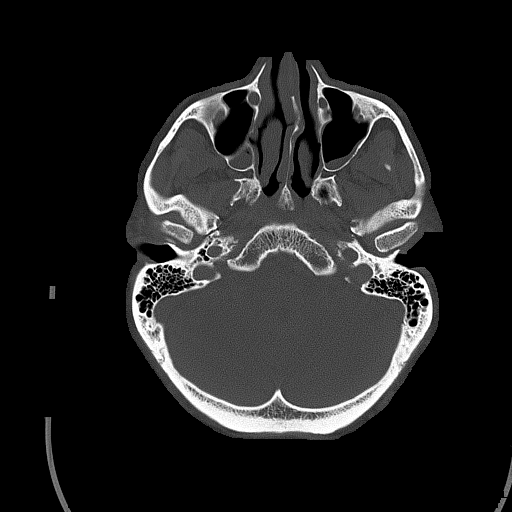

[Series 5: head without cor · coronal · non-contrast · 0.33mm/px · 3 of 73 slices shown]
[im 25/73  brain]
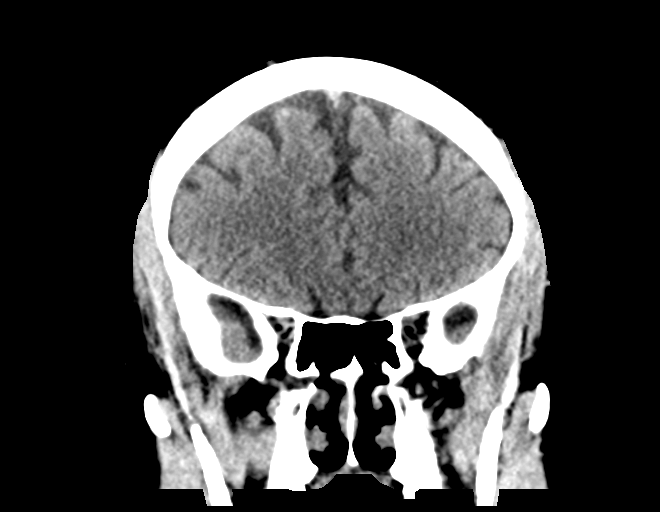
[im 33/73  brain]
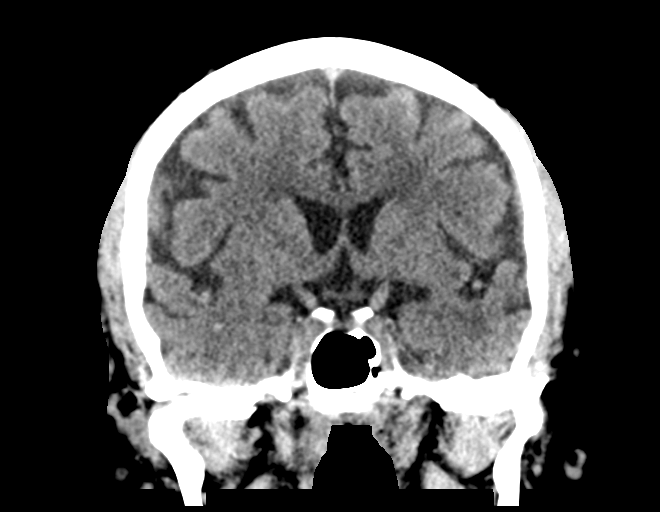
[im 41/73  brain]
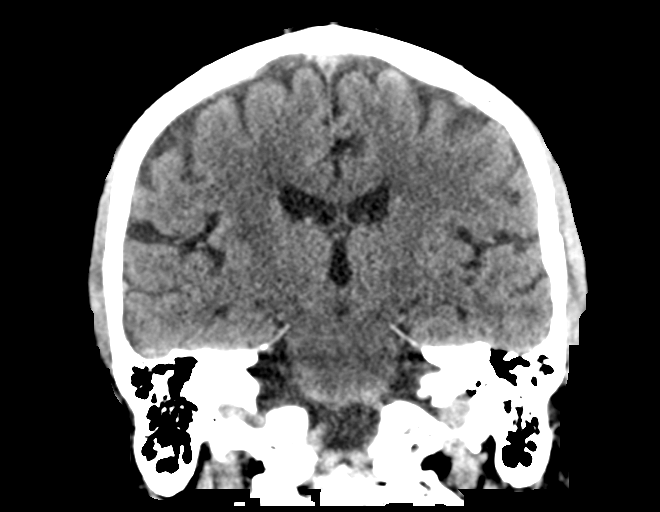

[Series 6: head without sag · sagittal · non-contrast · 0.33mm/px · 3 of 67 slices shown]
[im 23/67  brain]
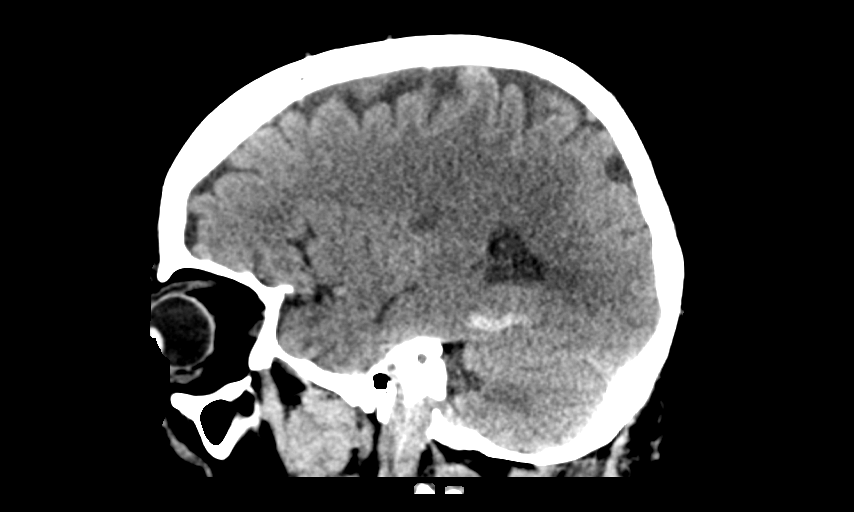
[im 34/67  brain]
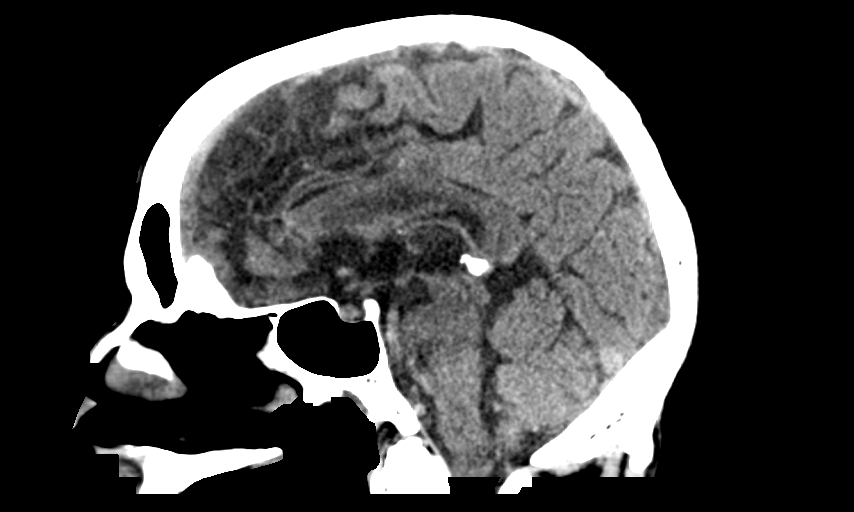
[im 45/67  brain]
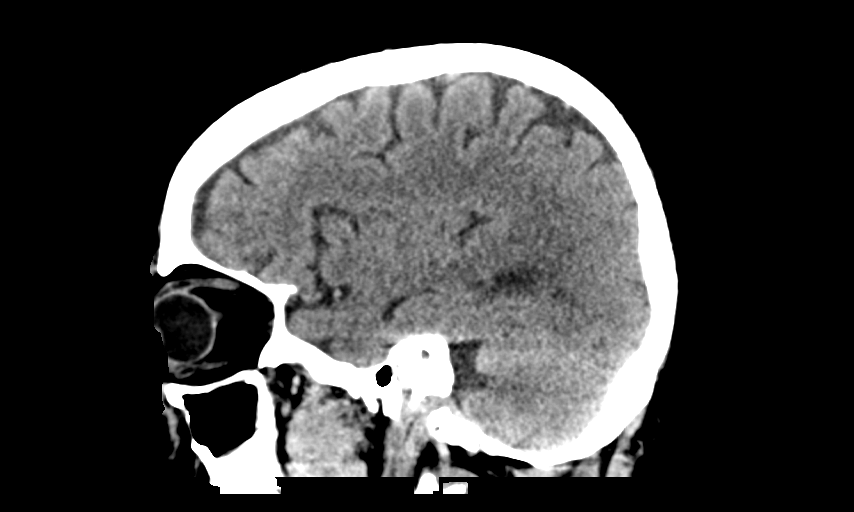

[15 of 47 positions shown; findings below may reference images not displayed]

FINDINGS: Brain: Again noted is the posterior right temporal lobe hemorrhage
which measures 2.6 x 1.0 x 1.3 cm. Volume 1.7 mL. Visually, no
significant change since prior study. No new areas of hemorrhage. No
mass effect or midline shift. No hydrocephalus.

Vascular: No hyperdense vessel or unexpected calcification.

Skull: No acute calvarial abnormality.

Sinuses/Orbits: Mucosal thickening in the maxillary sinuses.

Other: None
IMPRESSION: Posterior right temporal intraparenchymal hemorrhage, not
significantly changed since prior study.

## 2021-08-25 IMAGING — MR MR MRA HEAD W/O CM
1 series · 17 of 48 positions shown · non-contrast
Comparison: Head CTs [DATE] and [DATE].

CLINICAL DATA: 55-year-old male with neurologic deficit. Right
temporal lobe hemorrhage.

EXAM:
MRI HEAD WITHOUT CONTRAST AND WITH
MRA HEAD WITHOUT CONTRAST
TECHNIQUE: Multiplanar, multi-echo pulse sequences of the brain and surrounding
structures were acquired without and with intravenous contrast.
Angiographic images of the Circle of Willis were acquired using MRA
technique without intravenous contrast.

[Series 1: 3d cow · axial · 0.5mm · 0.41mm/px · z∈[-48,+40]mm · 17 of 188 slices shown]
[im 1/188]
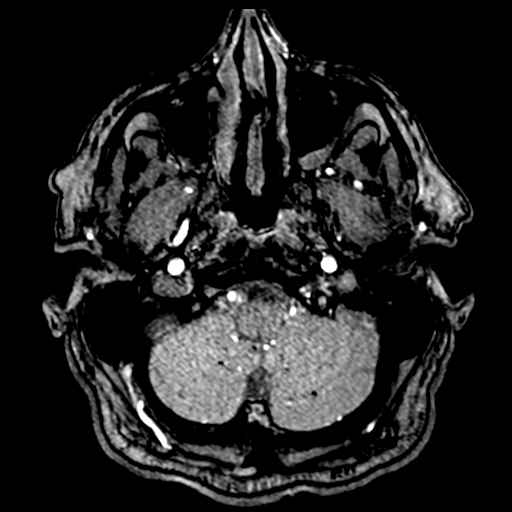
[im 4/188]
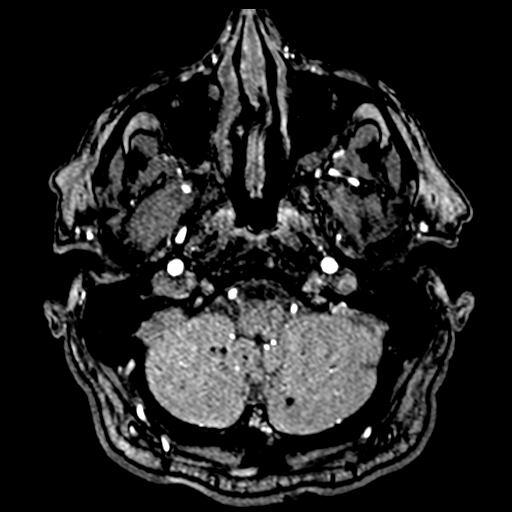
[im 8/188]
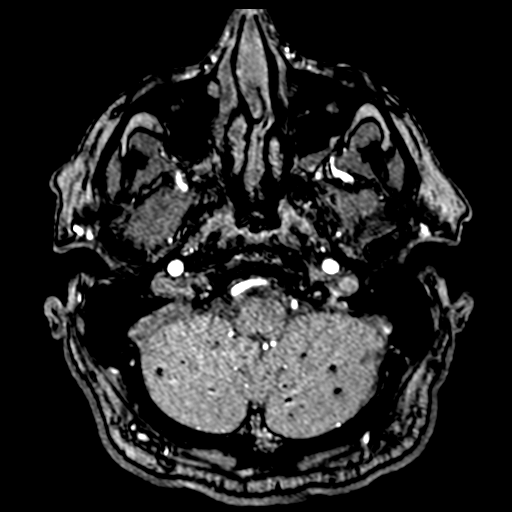
[im 12/188]
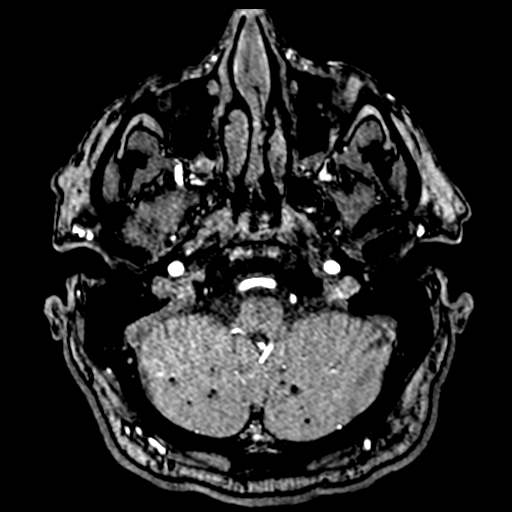
[im 16/188]
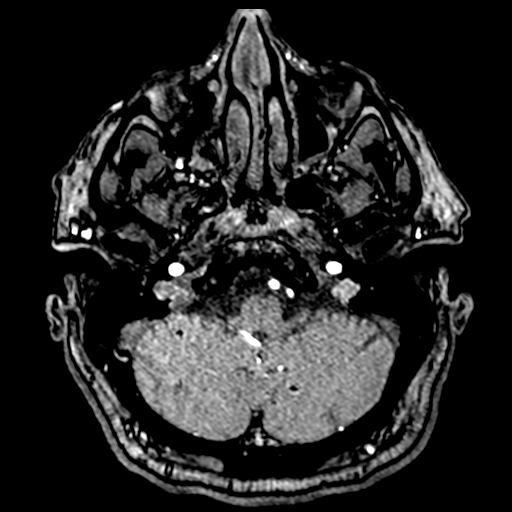
[im 20/188]
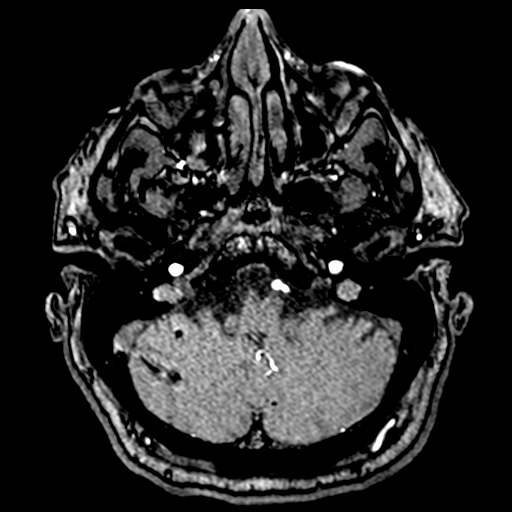
[im 24/188]
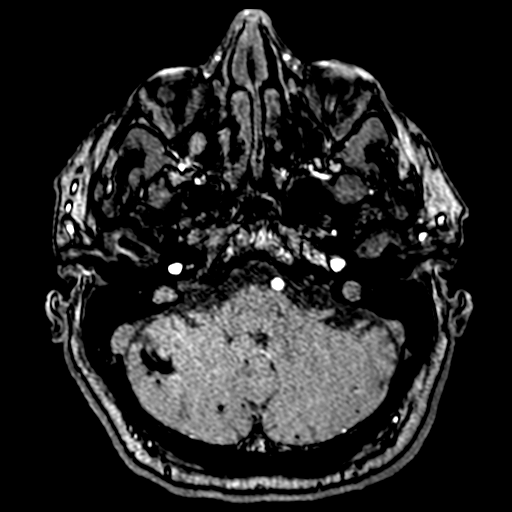
[im 32/188]
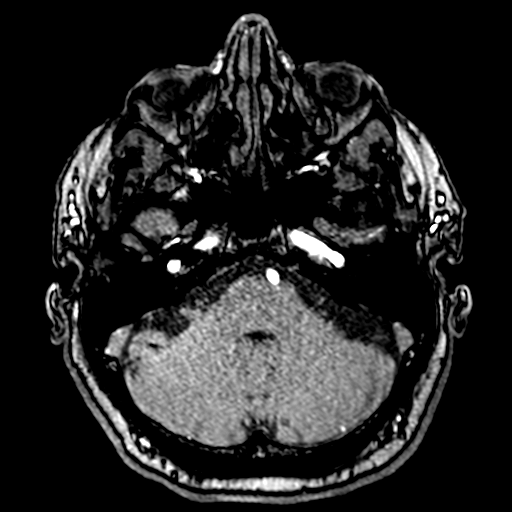
[im 36/188]
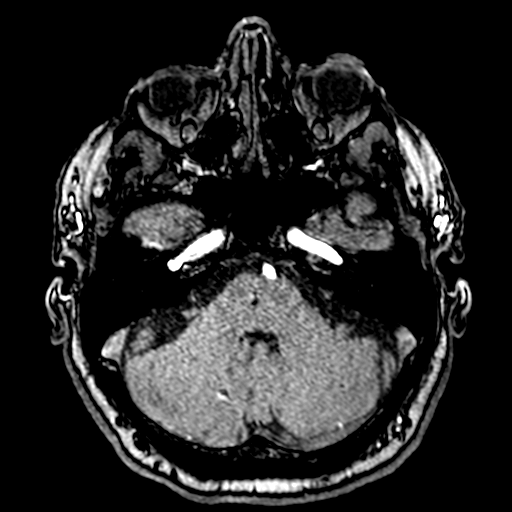
[im 60/188]
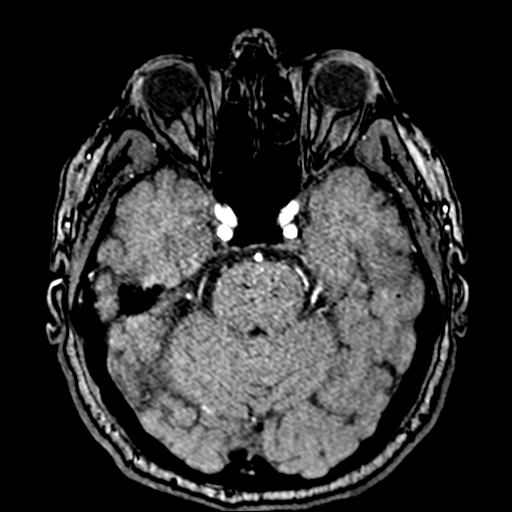
[im 84/188]
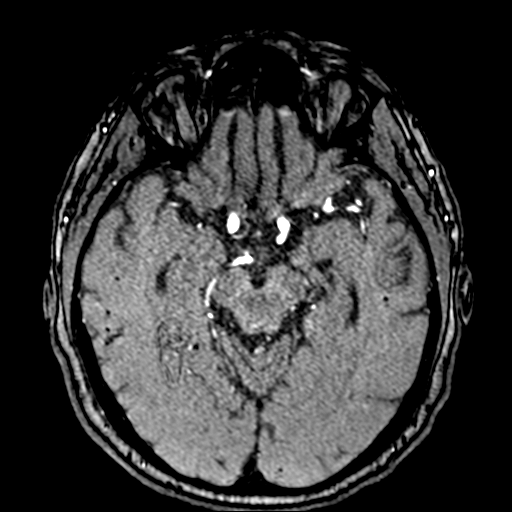
[im 96/188]
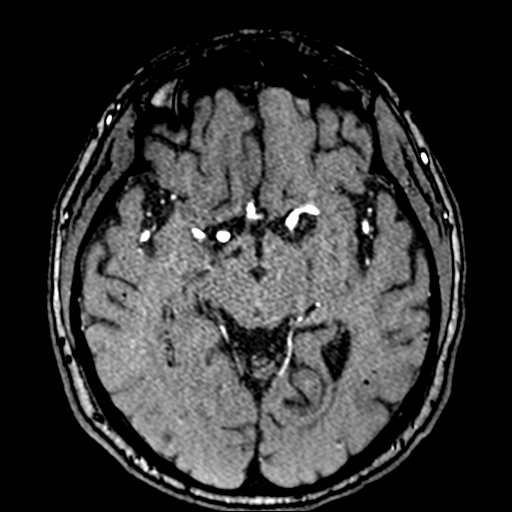
[im 108/188]
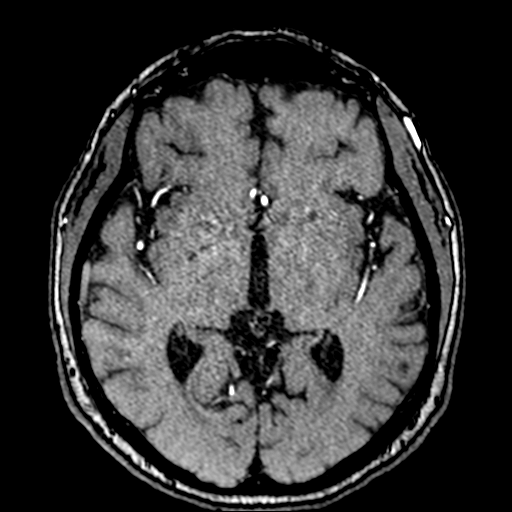
[im 132/188]
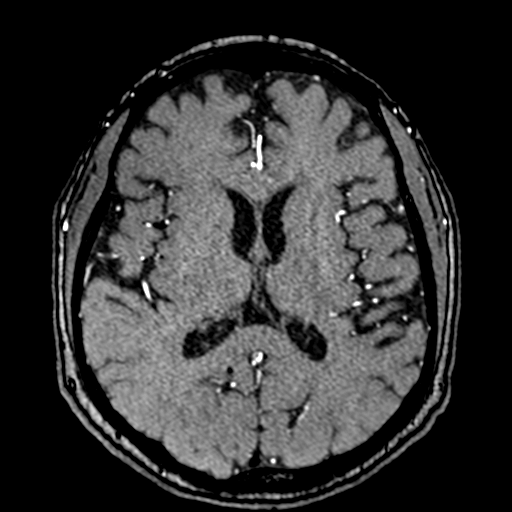
[im 156/188]
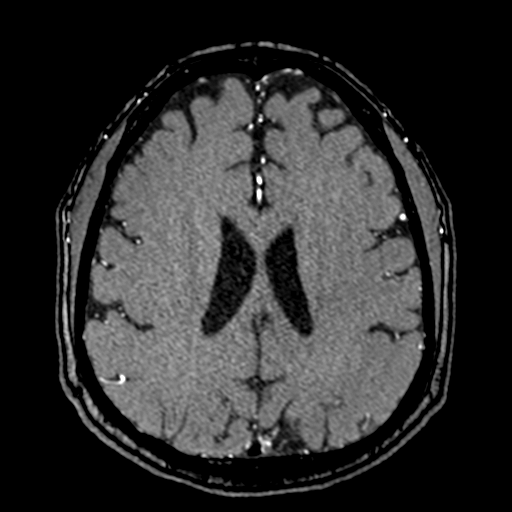
[im 160/188]
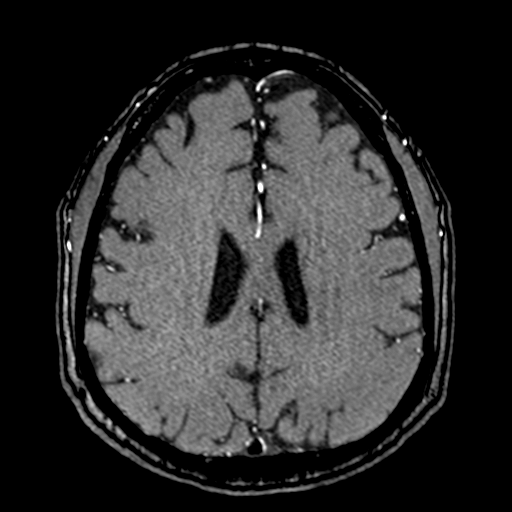
[im 180/188]
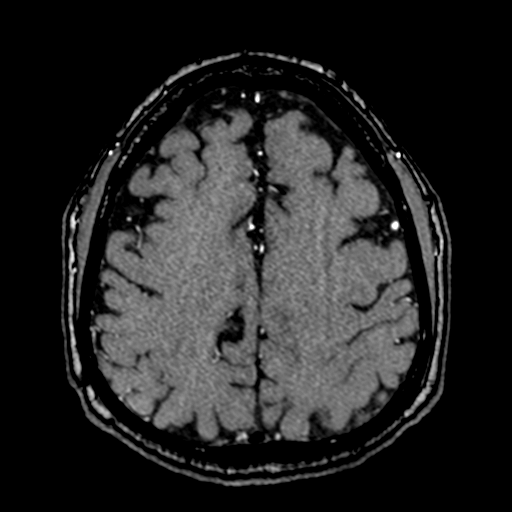

[17 of 48 positions shown; findings below may reference images not displayed]

FINDINGS: MRI HEAD FINDINGS

Brain: T1 isointense mix signal intensity T2 intra-axial hemorrhage
in the right temporal lobe fusiform gyrus encompasses about 30 x 11
x 14 mm (AP by transverse by CC) for an estimated blood volume of
2-3 mL. Mild regional edema. No significant regional mass effect. No
abnormal enhancement identified.

No intraventricular or extra-axial extension. No intraventricular
blood or debris identified. Susceptibility artifact on DWI. No
larger area of restricted diffusion.

Numerous chronic microhemorrhages in the brain, most severe
throughout the brainstem and cerebellum. But multiple hemispheric
foci of chronic hemosiderin also, including involving a 13 mm area
at the junction of the right posterior parietal and occipital lobes
on series 11, image 40. And contralateral posterior left temporal
lobe patchy edema or encephalomalacia also appears associated with
chronic hemosiderin on the SWI (although susceptibility artifact
affects detail).

No superimposed midline shift, mass effect, evidence of mass lesion,
ventriculomegaly, extra-axial collection. Cervicomedullary junction
and pituitary are within normal limits. No abnormal enhancement
identified. No dural thickening. Chronic lacunar infarct of the
posterior right external capsule and moderate for age other
scattered bilateral white matter T2 and FLAIR hyperintensity.
Moderate T2 and FLAIR heterogeneity throughout the brainstem.

Vascular: Major intracranial vascular flow voids are preserved.
Distal right vertebral artery appears dominant. The major dural
venous sinuses are enhancing and appear to be patent.

Skull and upper cervical spine: Negative visible cervical spine.
Visualized bone marrow signal is within normal limits.

Sinuses/Orbits: Negative orbits. Mild to moderate paranasal sinus
mucosal thickening.

Other: Mastoids are clear. Visible internal auditory structures
appear normal.

MRA HEAD FINDINGS

Anterior circulation: Antegrade flow in both ICA siphons. Mild
bilateral ICA tortuosity. No ICA stenosis. Normal left posterior
communicating artery and ophthalmic artery origins. Patent carotid
termini. Normal MCA and ACA origins. Mildly dominant right ACA A1.
Normal anterior communicating artery. Visible bilateral MCA and ACA
branches are within normal limits.

Posterior circulation: Antegrade flow in the posterior circulation
with dominant distal right vertebral artery. Patent vertebrobasilar
junction. Mild basilar artery irregularity. No distal vertebral or
basilar stenosis. SCA and right PCA origins are normal. Fetal type
left PCA origin. Diminutive or absent right posterior communicating
artery. Bilateral PCA branches are within normal limits.

No abnormal flow signal about the right temporal lobe hemorrhage.

Anatomic variants: Fetal type left PCA origin. Dominant right
vertebral artery and right ACA A1.
IMPRESSION: 1. Small acute intra-axial hemorrhage in the right temporal lobe
fusiform gyrus, estimated blood volume 2-3 mL. Mild regional edema
with no mass effect. No abnormal enhancement, extra-axial extension,
or other complicating features.

2. Numerous chronic microhemorrhages in the brain.
These are most severe in the brainstem and cerebellum, but also
scattered in the cerebral hemispheres with evidence of a previous
right parieto-occipital hemorrhage.
And there may be mild left temporal lobe gyral edema associated with
some of the chronic blood products.
Advanced for age but comparatively minor other evidence of chronic
small vessel disease.

3. This constellation is suspicious for Amyloid Angiopathy, despite
the relatively young age group. Is there history of renal failure or
other risk factor for amyloidosis? Otherwise assume advanced chronic
hypertension or other advanced small vessel disease.

4. Negative intracranial MRA.

## 2021-08-25 MED ORDER — LABETALOL HCL 5 MG/ML IV SOLN
10.0000 mg | INTRAVENOUS | Status: DC | PRN
  Administered 2021-08-26: 20 mg via INTRAVENOUS
  Filled 2021-08-25: qty 4

## 2021-08-25 MED ORDER — GADOBUTROL 1 MMOL/ML IV SOLN
10.0000 mL | Freq: Once | INTRAVENOUS | Status: AC | PRN
  Administered 2021-08-25: 10 mL via INTRAVENOUS

## 2021-08-25 MED ORDER — LISINOPRIL 20 MG PO TABS
20.0000 mg | ORAL_TABLET | Freq: Two times a day (BID) | ORAL | Status: DC
  Administered 2021-08-25 – 2021-08-27 (×4): 20 mg via ORAL
  Filled 2021-08-25 (×4): qty 1

## 2021-08-25 MED ORDER — CHLORHEXIDINE GLUCONATE CLOTH 2 % EX PADS
6.0000 | MEDICATED_PAD | Freq: Every day | CUTANEOUS | Status: DC
  Administered 2021-08-25 (×2): 6 via TOPICAL

## 2021-08-25 MED ORDER — HYDRALAZINE HCL 20 MG/ML IJ SOLN
20.0000 mg | Freq: Four times a day (QID) | INTRAMUSCULAR | Status: DC | PRN
  Administered 2021-08-25: 20 mg via INTRAVENOUS
  Filled 2021-08-25: qty 1

## 2021-08-25 MED ORDER — AMLODIPINE BESYLATE 10 MG PO TABS
10.0000 mg | ORAL_TABLET | Freq: Every day | ORAL | Status: DC
  Administered 2021-08-25 – 2021-08-27 (×3): 10 mg via ORAL
  Filled 2021-08-25 (×3): qty 1

## 2021-08-25 MED ORDER — LISINOPRIL 5 MG PO TABS
5.0000 mg | ORAL_TABLET | Freq: Every day | ORAL | Status: DC
  Administered 2021-08-25: 5 mg via ORAL
  Filled 2021-08-25: qty 1

## 2021-08-25 MED ORDER — TRAMADOL HCL 50 MG PO TABS
50.0000 mg | ORAL_TABLET | Freq: Four times a day (QID) | ORAL | Status: DC | PRN
  Administered 2021-08-25 – 2021-08-26 (×4): 50 mg via ORAL
  Filled 2021-08-25 (×4): qty 1

## 2021-08-25 MED ORDER — BUTALBITAL-APAP-CAFFEINE 50-325-40 MG PO TABS
1.0000 | ORAL_TABLET | Freq: Two times a day (BID) | ORAL | Status: DC | PRN
  Administered 2021-08-25: 1 via ORAL
  Filled 2021-08-25 (×2): qty 1

## 2021-08-25 MED ORDER — PANTOPRAZOLE SODIUM 40 MG PO TBEC
40.0000 mg | DELAYED_RELEASE_TABLET | Freq: Every day | ORAL | Status: DC
  Administered 2021-08-25 – 2021-08-27 (×3): 40 mg via ORAL
  Filled 2021-08-25 (×3): qty 1

## 2021-08-25 MED ORDER — NICOTINE 21 MG/24HR TD PT24
21.0000 mg | MEDICATED_PATCH | Freq: Every day | TRANSDERMAL | Status: DC
  Administered 2021-08-25 – 2021-08-27 (×3): 21 mg via TRANSDERMAL
  Filled 2021-08-25 (×4): qty 1

## 2021-08-25 MED ORDER — LABETALOL HCL 5 MG/ML IV SOLN
10.0000 mg | INTRAVENOUS | Status: DC | PRN
  Administered 2021-08-25: 10 mg via INTRAVENOUS
  Filled 2021-08-25: qty 4

## 2021-08-25 NOTE — Discharge Instructions (Signed)
                  Intensive Outpatient Programs  [facility]    [facility] [address]     [address] [place],  [place]     [place], [place] [phone]      [phone]  [facility]   [facility]  (Inpatient and outpatient)  [phone] (Suboxone and Methadone) [address]           [phone]           [facility]    [facility] - Intensive Outpatient [address]     [address] [place], [place] [address]     [place], [place]  [phone]      [phone]  [facility] (Outpatient, Inpatient, [facility]  [facility] (Groups and Residental) (insurance only) [phone]    [place], [place]          [phone]       [facility]    [facility] (for caregivers and family) [address]     [address] [place], [place]     [place], [place] [phone]      [phone]  Residential Treatment Programs  [facility]  [facility](2 years) Residential: 90 days)  [facility] [address]      [address] [place], [place]     [place], [place] [phone]      [phone] or [phone]  [facility]    [facility] [address]      [address] [place], [place]     [place], [place] [phone]      [phone]  [facility]   [facility] ([facility]) [address]     [address] [place], [place] [address]     [place], [place] [phone]      [phone] Admissions: 8am-3pm M-F  [facility]: Residential Program (90 Days)              [facility]: [facility]  [place], [place]     [place], [place]  [phone] or [phone]    (Walk in Hours over the weekend or by referral)   [facility]: [facility]:[phone] (for crisis  response 24 hours a day) 

## 2021-08-25 NOTE — Progress Notes (Signed)
PT Cancellation Note  Patient Details Name: Jesus Warren MRN: [id number] DOB: 15-Sep-1965   Cancelled Treatment:    Reason Eval/Treat Not Completed: Patient not medically ready  Awaiting stroke MD's assessment and whether pt is ok to mobilize and complete PT evaluation. Will continue to monitor and hopeful to see once MD has cleared.    Arby Barrette, PT Acute Rehabilitation Services  Pager [phone] Office [phone]    Rexanne Mano 08/25/2021, 10:53 AM

## 2021-08-25 NOTE — Progress Notes (Signed)
°  Transition of Care [facility]) Screening Note   Patient Details  Name: Jesus Warren Date of Birth: 1965/08/25   Transition of [facility]) CM/SW Contact:    Alfredia Ferguson, LCSW Phone Number: 08/25/2021, 7:51 AM    Transition of Care Department [facility]) has reviewed patient and no TOC needs have been identified at this time. CSW has added substance use information into patient's AVS. We will continue to monitor patient advancement through interdisciplinary progression rounds. If new patient transition needs arise, please place a TOC consult.

## 2021-08-25 NOTE — Evaluation (Signed)
Physical Therapy Evaluation and Discharge Patient Details Name: Jesus Warren MRN: [id number] DOB: [birth year] Today's Date: 08/25/2021  History of Present Illness  This 56 y.o. male admitted with 3 day h/o HA, confusion, and staggering gait as well as slurred speech.  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  PMH includes: anxiety, low testosterone  Clinical Impression   Patient evaluated by Physical Therapy with no further acute PT needs identified. Scored 56/56 on Berg Balance Assessment. All education has been completed and the patient has no further questions. PT is signing off. Thank you for this referral.        Recommendations for follow up therapy are one component of a multi-disciplinary discharge planning process, led by the attending physician.  Recommendations may be updated based on patient status, additional functional criteria and insurance authorization.  Follow Up Recommendations No PT follow up    Assistance Recommended at Discharge None  Patient can return home with the following       Equipment Recommendations None recommended by PT  Recommendations for Other Services       Functional Status Assessment Patient has not had a recent decline in their functional status     Precautions / Restrictions Precautions Precautions: None      Mobility  Bed Mobility Overal bed mobility: Independent                  Transfers Overall transfer level: Independent Equipment used: None                    Ambulation/Gait Ambulation/Gait assistance: Independent Gait Distance (Feet): 300 Feet Assistive device: None Gait Pattern/deviations: WFL(Within Functional Limits)   Gait velocity interpretation: >2.62 ft/sec, indicative of community ambulatory      Stairs            Wheelchair Mobility    Modified Rankin (Stroke Patients Only) Modified Rankin (Stroke Patients Only) Pre-Morbid Rankin  Score: No symptoms Modified Rankin: No significant disability     Balance Overall balance assessment: Independent                               Standardized Balance Assessment Standardized Balance Assessment : Berg Balance Test;Dynamic Gait Index Berg Balance Test Sit to Stand: Able to stand without using hands and stabilize independently Standing Unsupported: Able to stand safely 2 minutes Sitting with Back Unsupported but Feet Supported on Floor or Stool: Able to sit safely and securely 2 minutes Stand to Sit: Sits safely with minimal use of hands Transfers: Able to transfer safely, minor use of hands Standing Unsupported with Eyes Closed: Able to stand 10 seconds safely Standing Ubsupported with Feet Together: Able to place feet together independently and stand 1 minute safely From Standing, Reach Forward with Outstretched Arm: Can reach confidently >25 cm (10") From Standing Position, Pick up Object from Floor: Able to pick up shoe safely and easily From Standing Position, Turn to Look Behind Over each Shoulder: Looks behind from both sides and weight shifts well Turn 360 Degrees: Able to turn 360 degrees safely in 4 seconds or less Standing Unsupported, Alternately Place Feet on Step/Stool: Able to stand independently and safely and complete 8 steps in 20 seconds Standing Unsupported, One Foot in Front: Able to place foot tandem independently and hold 30 seconds Standing on One Leg: Able to lift leg independently and hold > 10 seconds Total  Score: 56 Dynamic Gait Index Level Surface: Normal Change in Gait Speed: Normal Gait with Horizontal Head Turns: Normal Gait with Vertical Head Turns: Normal Gait and Pivot Turn: Normal       Pertinent Vitals/Pain Pain Assessment: No/denies pain    Home Living Family/patient expects to be discharged to:: Private residence Living Arrangements: Spouse/significant other Available Help at Discharge: Family;Available 24  hours/day (temporarily; wife works fulltime) Type of Home: [employer] Home Access: Stairs to enter   Technical brewer of Steps: [place]: None      Prior Function Prior Level of Function : Independent/Modified Independent;Driving                     Hand Dominance        Extremity/Trunk Assessment   Upper Extremity Assessment Upper Extremity Assessment: Defer to OT evaluation    Lower Extremity Assessment Lower Extremity Assessment: Overall WFL for tasks assessed    Cervical / Trunk Assessment Cervical / Trunk Assessment: Normal  Communication   Communication: No difficulties  Cognition Arousal/Alertness: Awake/alert Behavior During Therapy: WFL for tasks assessed/performed Overall Cognitive Status: History of cognitive impairments - at baseline                                          General Comments General comments (skin integrity, edema, etc.): wife present    Exercises     Assessment/Plan    PT Assessment Patient does not need any further PT services  PT Problem List         PT Treatment Interventions      PT Goals (Current goals can be found in the Care Plan section)  Acute Rehab PT Goals PT Goal Formulation: All assessment and education complete, DC therapy    Frequency       Co-evaluation               AM-PAC PT "6 Clicks" Mobility  Outcome Measure Help needed turning from your back to your side while in a flat bed without using bedrails?: None Help needed moving from lying on your back to sitting on the side of a flat bed without using bedrails?: None Help needed moving to and from a bed to a chair (including a wheelchair)?: None Help needed standing up from a chair using your arms (e.g., wheelchair or bedside chair)?: None Help needed to walk in hospital room?: None Help needed climbing 3-5 steps with a railing? : None 6 Click Score: 24    End of Session Equipment Utilized During Treatment:  Gait belt Activity Tolerance: Patient tolerated treatment well Patient left: in bed;with call bell/phone within reach;with family/visitor present Nurse Communication: Mobility status PT Visit Diagnosis: Other symptoms and signs involving the nervous system (R29.898)    Time: 6301-6010 PT Time Calculation (min) (ACUTE ONLY): 14 min   Charges:   PT Evaluation $PT Eval Low Complexity: [place], PT Acute Rehabilitation Services  Pager [phone] Office [phone]   Rexanne Mano 08/25/2021, 1:05 PM

## 2021-08-25 NOTE — Plan of Care (Signed)
?  Problem: Education: ?Goal: Knowledge of disease or condition will improve ?Outcome: Progressing ?Goal: Knowledge of secondary prevention will improve (SELECT ALL) ?Outcome: Progressing ?Goal: Knowledge of patient specific risk factors will improve (INDIVIDUALIZE FOR PATIENT) ?Outcome: Progressing ?  ?

## 2021-08-25 NOTE — Progress Notes (Incomplete)
STUDENT FOLLOW-UP NOTES - DISREGARD       CC/HPI: Presents with confusion, staggering gait, slurred speech, taking ibuprofen+tylenol at home for headache and hypertensive emergecy. No PCP since 2017  PMH: ED, HLD, low testosterone, anxiety  PLAN:  F/u DVT ppx at 24h - Lovenox 40mg  SQ QD F/u K being repleted 80 mEq; f/u Mg, Phos F/u CT d/t worsening headache and emesis x1; CT ordered? 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

## 2021-08-25 NOTE — Progress Notes (Signed)
Pt. Report of worsening headache post analgesic administration, emesis x1, no other changes. A&OX4, neuro status unchanged other than headache and emesis. Dr. Erlinda Hong notified of findings, order for stat head CT given.

## 2021-08-25 NOTE — Evaluation (Signed)
Speech Language Pathology Evaluation Patient Details Name: Jesus Warren MRN: [id number] DOB: 10-29-1965 Today's Date: 08/25/2021 Time: 6503-5465 SLP Time Calculation (min) (ACUTE ONLY): 22 min  Problem List:  Patient Active Problem List   Diagnosis Date Noted   ICH (intracerebral hemorrhage) ([place]) 08/24/2021   Past Medical History:  Past Medical History:  Diagnosis Date   Allergic rhinitis    Anxiety    Erectile dysfunction    Hyperlipidemia    Low testosterone    Umbilical hernia    Past Surgical History:  Past Surgical History:  Procedure Laterality Date   COLONOSCOPY WITH PROPOFOL N/A 08/05/2016   Procedure: COLONOSCOPY WITH PROPOFOL;  Surgeon: Lollie Sails, MD;  [facility];  Service: Endoscopy;  Laterality: N/A;   VASECTOMY     HPI:  Pt is a 56 y.o. male who presented with 3 day history of headaches and since  08/24/21 had confusion with staggering gait, slurred speech. MRI brain 1/8: Small acute intra-axial hemorrhage in the right temporal lobe fusiform gyrus, estimated blood volume 2-3 mL. TNK not given due to [place]. PMH: erectile dysfunction, hyperlipidemia, low testosterone, colon.   Assessment / Plan / Recommendation Clinical Impression  Pt participated in speech/language/cognition evaluation with his wife present. Pt and his wife reported that the pt was employed as a Chemical engineer for many years, but has been unemployed for the past 10 months while he assesses options for his next career path. Per the pt's wife, in the last six months, she has noticed increased difficulty with memory, and attention; she cited instances of his going to do something and becoming distracted, saying something and then forgetting it, forgetting things that he's been told, and not recalling whether or not he has eaten meals. Pt's wife stated that despite these cognitive changes, she he is still functional and has not had any difficulty with medication  management or with time management as it relates to appointments. Speech and language skills were WNL. [facility] Mental Status Examination was completed to evaluate the pt's cognitive-linguistic skills. He achieved a score of 25/30 which is slightly below the normal limits of 27 or more out of 30 and is suggestive of a mild impairment. He exhibited deficits in the areas of awareness, memory, attention, and executive function. Pt and his wife agreed that the pt's performance is his baseline. Further acute skilled SLP services are therefore not clinically indicated at this time. However, it is recommended that pt follow up with neurology and possibly neuropsych to assess the etiology of pt's cognitive decline. Pt and his wife were educated regarding results and recommendations; both parties verbalized understanding as well as agreement with plan of care.    SLP Assessment  SLP Recommendation/Assessment: All further Speech Lanaguage Pathology  needs can be addressed in the next venue of care SLP Visit Diagnosis: Cognitive communication deficit (R41.841)    Recommendations for follow up therapy are one component of a multi-disciplinary discharge planning process, led by the attending physician.  Recommendations may be updated based on patient status, additional functional criteria and insurance authorization.    Follow Up Recommendations   (Pending etiology of pt's cognitive decline)    Assistance Recommended at Discharge  None  Functional Status Assessment Patient has not had a recent decline in their functional status  Frequency and Duration           SLP Evaluation Cognition  Overall Cognitive Status: History of cognitive impairments - at baseline Arousal/Alertness: Awake/alert  Orientation Level: Oriented X4 Year: 2023 Month: January Day of Week: Correct Attention: Focused;Sustained Focused Attention: Impaired Focused Attention Impairment: Verbal complex Sustained  Attention: Impaired Sustained Attention Impairment: Verbal complex Memory: Impaired Memory Impairment: Retrieval deficit;Decreased short term memory (Immediate: 5/5 with repetition; delayed: 4/5 with cue: 1/1) Awareness: Impaired Awareness Impairment: Intellectual impairment Executive Function: Sequencing;Organizing Sequencing: Appears intact (clock drawing: 4/4) Organizing: Impaired Organizing Impairment: Verbal complex (backward digit span: 0/2)       Comprehension  Auditory Comprehension Overall Auditory Comprehension: Appears within functional limits for tasks assessed Yes/No Questions: Within Functional Limits Commands: Within Functional Limits Conversation: Complex    Expression Expression Primary Mode of Expression: Verbal Verbal Expression Overall Verbal Expression: Appears within functional limits for tasks assessed Initiation: No impairment Level of Generative/Spontaneous Verbalization: Conversation Repetition: No impairment Naming: No impairment   Oral / Motor  Oral Motor/Sensory Function Overall Oral Motor/Sensory Function: Within functional limits Motor Speech Overall Motor Speech: Appears within functional limits for tasks assessed Respiration: Within functional limits Phonation: Normal Resonance: Within functional limits Articulation: Within functional limitis Intelligibility: Intelligible Motor Planning: Witnin functional limits Motor Speech Errors: Not applicable           Tali Coster I. Hardin Negus, [place], [place] Office number [phone] Pager [place] 08/25/2021, 1:43 PM

## 2021-08-25 NOTE — ED Notes (Addendum)
Pt transported to CT and will be transferred to 4N.

## 2021-08-25 NOTE — Evaluation (Signed)
Occupational Therapy Evaluation Patient Details Name: Jesus Warren MRN: [id number] DOB: March 07, 1966 Today's Date: 08/25/2021   History of Present Illness This 56 y.o. male admitted with 3 day h/o HA, confusion, and staggering gait as well as slurred speech.  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 These are most severe in the brainstem and cerebellum, but also scattered in the cerebral hemispheres with evidence of a previous right parieto-occipital hemorrhage. And there may be mild left temporal lobe gyral edema associated with some of the chronic blood products. Advanced for age but comparatively minor other evidence of chronic  small vessel disease. 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 He demonstrates the below listed deficits and will benefit from continued OT to maximize safety and independence with BADLs.  Pt presents to OT with mild higher level cognitive deficits - wife indicates that he was having some difficulty with memory and attention at baseline, but does not feel he is fully back to baseline.  He is able to complete ADLs independently.  He lives with wife and was fully independent PTA.  Recommend supervision with medication management, at least initially, at discharge, and recommend OPOT to ensure return to baseline and for compensatory strategies for functional cognition and deficits with executive functioning.        Recommendations for follow up therapy are one component of a multi-disciplinary discharge planning process, led by the attending physician.  Recommendations may be updated based on patient status, additional functional criteria and  insurance authorization.   Follow Up Recommendations  Outpatient OT    Assistance Recommended at Discharge Intermittent Supervision/Assistance  Patient can return home with the following Direct supervision/assist for medications management (initially)    Functional Status Assessment  Patient has had a recent decline in their functional status and demonstrates the ability to make significant improvements in function in a reasonable and predictable amount of time.  Equipment Recommendations  None recommended by OT    Recommendations for Other Services       Precautions / Restrictions Precautions Precautions: None      Mobility Bed Mobility Overal bed mobility: Independent                  Transfers Overall transfer level: Independent Equipment used: None                      Balance Overall balance assessment: Independent                               Standardized Balance Assessment Standardized Balance Assessment : Berg Balance Test;Dynamic Gait Index Berg Balance Test Sit to Stand: Able to stand without using hands and stabilize independently Standing Unsupported: Able to stand safely 2 minutes Sitting with Back Unsupported but Feet Supported on Floor or Stool: Able to sit safely and securely 2 minutes Stand to Sit: Sits safely with minimal use of hands Transfers: Able to transfer safely, minor use of hands Standing Unsupported with Eyes Closed: Able to stand 10 seconds safely Standing Ubsupported with Feet Together: Able to place feet together independently and stand 1 minute safely From Standing, Reach Forward with Outstretched  Arm: Can reach confidently >25 cm (10") From Standing Position, Pick up Object from Floor: Able to pick up shoe safely and easily From Standing Position, Turn to Look Behind Over each Shoulder: Looks behind from both sides and weight shifts well Turn 360 Degrees: Able to turn 360 degrees safely in 4 seconds or  less Standing Unsupported, Alternately Place Feet on Step/Stool: Able to stand independently and safely and complete 8 steps in 20 seconds Standing Unsupported, One Foot in Front: Able to place foot tandem independently and hold 30 seconds Standing on One Leg: Able to lift leg independently and hold > 10 seconds Total Score: 56 Dynamic Gait Index Level Surface: Normal Change in Gait Speed: Normal Gait with Horizontal Head Turns: Normal Gait with Vertical Head Turns: Normal Gait and Pivot Turn: Normal     ADL either performed or assessed with clinical judgement   ADL Overall ADL's : Independent                                       General ADL Comments: able to simulate showering without LOB - was able to stand on one leg without difficulty to access foot     Vision Baseline Vision/History: 1 Wears glasses Ability to See in Adequate Light: 0 Adequate Patient Visual Report: No change from baseline Vision Assessment?: Yes Eye Alignment: Within Functional Limits Alignment/Gaze Preference: Within Defined Limits Tracking/Visual Pursuits: Able to track stimulus in all quads without difficulty Visual Fields: No apparent deficits     Perception     Praxis      Pertinent Vitals/Pain Pain Assessment: No/denies pain     Hand Dominance Right   Extremity/Trunk Assessment Upper Extremity Assessment Upper Extremity Assessment: Overall WFL for tasks assessed   Lower Extremity Assessment Lower Extremity Assessment: Overall WFL for tasks assessed   Cervical / Trunk Assessment Cervical / Trunk Assessment: Normal   Communication Communication Communication: No difficulties   Cognition Arousal/Alertness: Awake/alert Behavior During Therapy: WFL for tasks assessed/performed Overall Cognitive Status: Impaired/Different from baseline Area of Impairment: Attention;Problem solving                   Current Attention Level: Alternating;Divided          Problem Solving:  (mild difficulty with complex problem solving) General Comments: Pt scored 4/28 on the Short Blessed Test which is Within normal limits.  He was noted to frequently self distract, but was able to work his way back to the question/task at hand.  Wife reports that although he scored within normal limits, the errors that he made were not typical of his pre hospital status, and that he should have been able to perform task without difficulty     General Comments  wife present.  SPB < 156-159 throughout session    Exercises     Shoulder Instructions      Home Living Family/patient expects to be discharged to:: Private residence Living Arrangements: Spouse/significant other Available Help at Discharge: Family;Available PRN/intermittently Type of Home: House Home Access: Stairs to enter [employer] of Steps: 5 Entrance Stairs-Rails: Left;Right;Can reach both Home Layout: Two level;Able to live on main level with bedroom/bathroom Alternate Level Stairs-Number of Steps: full flight   Bathroom Shower/Tub: Occupational psychologist: Handicapped height     Home Equipment: None      Lives With: Spouse    Prior Functioning/Environment Prior Level  of Function : Independent/Modified Independent             Mobility Comments: no AD ADLs Comments: Pt currently is in between jobs but manages the household such as cooking, cleaning, yard work, [employer], dog care.  Wife reports she has to sometimes makes lists as pt has decreased attention and memory at baseline        OT Problem List: Decreased cognition      OT Treatment/Interventions: Therapeutic activities;Cognitive remediation/compensation;Patient/family education    OT Goals(Current goals can be found in the care plan section) Acute Rehab OT Goals Patient Stated Goal: did not state OT Goal Formulation: With patient/family Time For Goal Achievement: 09/08/21 Potential to Achieve  Goals: Good ADL Goals Additional ADL Goal #1: Pt will follow 3 step command during path finding task independently Additional ADL Goal #2: Pt will perform moderately challenging path finding task with no cues Additional ADL Goal #3: Pt will perform simulated money management task independently  OT Frequency: Min 2X/week    Co-evaluation              AM-PAC OT "6 Clicks" Daily Activity     Outcome Measure Help from another person eating meals?: None Help from another person taking care of personal grooming?: None Help from another person toileting, which includes using toliet, bedpan, or urinal?: None Help from another person bathing (including washing, rinsing, drying)?: None Help from another person to put on and taking off regular upper body clothing?: None Help from another person to put on and taking off regular lower body clothing?: None 6 Click Score: 24   End of Session Equipment Utilized During Treatment: Gait belt Nurse Communication: Mobility status  Activity Tolerance: Patient tolerated treatment well Patient left: Other (comment) (with PT)  OT Visit Diagnosis: Cognitive communication deficit (R41.841) Symptoms and signs involving cognitive functions: Other Nontraumatic ICH                Time: 8550-1586 OT Time Calculation (min): 19 min Charges:  OT General Charges $OT Visit: 1 Visit OT Evaluation $OT Eval Low Complexity: 1 Low  Nilsa Nutting., OTR/L Acute Rehabilitation Services Pager [phone] Office [phone]   Lucille Passy M 08/25/2021, 1:48 PM

## 2021-08-25 NOTE — Progress Notes (Addendum)
STROKE TEAM PROGRESS NOTE   INTERVAL HISTORY Just got back from MRI. Wife at the bedside. Start norvasc, lisinopril and titrate down cleviprex, consider CTA and cerebral angiogram after ICH resolves. Transfer out of ICU after clexiprex is off. Patient reports headaches in the back of the head and takes acetaminophen and ibuprofen pretty consistently. He does not routinely take his blood pressure, but states that he has not been diagnosed with HTN. MRI today shows multiple microvascular hemorrhages and a prior parieto-occipital hemorrhage in addition to his current right temporal hemorrhage. Neurological symptoms seem to have returned to baseline. He his oriented and speech is clear. PT has signed off on him. SLP cognitive exam noted some difficulty with memory and attention, which his wife had noticed at home as well over the last 6 months.   Vitals:   08/25/21 0907 08/25/21 0911 08/25/21 0915 08/25/21 0930  BP: 133/75 135/76 133/75 125/72  Pulse: 84 82 82 85  Resp:      Temp:      TempSrc:      SpO2:  92%    Weight:      Height:       CBC:  Recent Labs  Lab 08/24/21 2007 08/24/21 2008 08/25/21 0942  WBC 8.9  --  9.3  NEUTROABS 5.0  --   --   HGB 15.0 15.0 15.4  HCT 42.1 44.0 42.2  MCV 91.1  --  90.0  PLT 125*  --  275   Basic Metabolic Panel:  Recent Labs  Lab 08/24/21 2007 08/24/21 2008  NA 140 141  K 3.3* 3.4*  CL 106 106  CO2 22  --   GLUCOSE 135* 137*  BUN 13 13  CREATININE 1.09 1.30*  CALCIUM 8.7*  --    Lipid Panel: No results for input(s): CHOL, TRIG, HDL, CHOLHDL, VLDL, LDLCALC in the last 168 hours. HgbA1c:  Recent Labs  Lab 08/25/21 0942  HGBA1C 5.4   Urine Drug Screen:  Recent Labs  Lab 08/25/21 0106  LABOPIA NONE DETECTED  COCAINSCRNUR NONE DETECTED  LABBENZ NONE DETECTED  AMPHETMU NONE DETECTED  THCU NONE DETECTED  LABBARB NONE DETECTED    Alcohol Level  Recent Labs  Lab 08/24/21 2024  ETH 296*    IMAGING past 24 hours CT HEAD WO  CONTRAST  Result Date: 08/25/2021 CLINICAL DATA:  Follow-up bleed EXAM: CT HEAD WITHOUT CONTRAST TECHNIQUE: Contiguous axial images were obtained from the base of the skull through the vertex without intravenous contrast. COMPARISON:  08/24/2021 FINDINGS: Brain: Again noted is the posterior right temporal lobe hemorrhage which measures 2.6 x 1.0 x 1.3 cm. Volume 1.7 mL. Visually, no significant change since prior study. No new areas of hemorrhage. No mass effect or midline shift. No hydrocephalus. Vascular: No hyperdense vessel or unexpected calcification. Skull: No acute calvarial abnormality. Sinuses/Orbits: Mucosal thickening in the maxillary sinuses. Other: None IMPRESSION: Posterior right temporal intraparenchymal hemorrhage, not significantly changed since prior study. Electronically Signed   By: Rolm Baptise M.D.   On: 08/25/2021 01:49   MR ANGIO HEAD WO CONTRAST  Result Date: 08/25/2021 CLINICAL DATA:  56 year old male with neurologic deficit. Right temporal lobe hemorrhage. EXAM: MRI HEAD WITHOUT CONTRAST AND WITH MRA HEAD WITHOUT CONTRAST TECHNIQUE: Multiplanar, multi-echo pulse sequences of the brain and surrounding structures were acquired without and with intravenous contrast. Angiographic images of the Circle of Willis were acquired using MRA technique without intravenous contrast. COMPARISON:  Head CTs 08/25/2021 and 08/24/2021. FINDINGS: MRI HEAD FINDINGS Brain: T1  isointense mix signal intensity T2 intra-axial hemorrhage in the right temporal lobe fusiform gyrus encompasses about 30 x 11 x 14 mm (AP by transverse by CC) for an estimated blood volume of 2-3 mL. Mild regional edema. No significant regional mass effect. No abnormal enhancement identified. No intraventricular or extra-axial extension. No intraventricular blood or debris identified. Susceptibility artifact on DWI. No larger area of restricted diffusion. Numerous chronic microhemorrhages in the brain, most severe throughout the  brainstem and cerebellum. But multiple hemispheric foci of chronic hemosiderin also, including involving a 13 mm area at the junction of the right posterior parietal and occipital lobes on series 11, image 40. And contralateral posterior left temporal lobe patchy edema or encephalomalacia also appears associated with chronic hemosiderin on the SWI (although susceptibility artifact affects detail). No superimposed midline shift, mass effect, evidence of mass lesion, ventriculomegaly, extra-axial collection. Cervicomedullary junction and pituitary are within normal limits. No abnormal enhancement identified. No dural thickening. Chronic lacunar infarct of the posterior right external capsule and moderate for age other scattered bilateral white matter T2 and FLAIR hyperintensity. Moderate T2 and FLAIR heterogeneity throughout the brainstem. Vascular: Major intracranial vascular flow voids are preserved. Distal right vertebral artery appears dominant. The major dural venous sinuses are enhancing and appear to be patent. Skull and upper cervical spine: Negative visible cervical spine. Visualized bone marrow signal is within normal limits. Sinuses/Orbits: Negative orbits. Mild to moderate paranasal sinus mucosal thickening. Other: Mastoids are clear. Visible internal auditory structures appear normal. MRA HEAD FINDINGS Anterior circulation: Antegrade flow in both ICA siphons. Mild bilateral ICA tortuosity. No ICA stenosis. Normal left posterior communicating artery and ophthalmic artery origins. Patent carotid termini. Normal MCA and ACA origins. Mildly dominant right ACA A1. Normal anterior communicating artery. Visible bilateral MCA and ACA branches are within normal limits. Posterior circulation: Antegrade flow in the posterior circulation with dominant distal right vertebral artery. Patent vertebrobasilar junction. Mild basilar artery irregularity. No distal vertebral or basilar stenosis. SCA and right PCA origins are  normal. Fetal type left PCA origin. Diminutive or absent right posterior communicating artery. Bilateral PCA branches are within normal limits. No abnormal flow signal about the right temporal lobe hemorrhage. Anatomic variants: Fetal type left PCA origin. Dominant right vertebral artery and right ACA A1. IMPRESSION: 1. Small acute intra-axial hemorrhage in the right temporal lobe fusiform gyrus, estimated blood volume 2-3 mL. Mild regional edema with no mass effect. No abnormal enhancement, extra-axial extension, or other complicating features. 2. Numerous chronic microhemorrhages in the brain. These are most severe in the brainstem and cerebellum, but also scattered in the cerebral hemispheres with evidence of a previous right parieto-occipital hemorrhage. And there may be mild left temporal lobe gyral edema associated with some of the chronic blood products. Advanced for age but comparatively minor other evidence of chronic small vessel disease. 3. This constellation is suspicious for Amyloid Angiopathy, despite the relatively young age group. Is there history of renal failure or other risk factor for amyloidosis? Otherwise assume advanced chronic hypertension or other advanced small vessel disease. 4. Negative intracranial MRA. Electronically Signed   By: Genevie Ann M.D.   On: 08/25/2021 09:42   MR BRAIN W WO CONTRAST  Result Date: 08/25/2021 CLINICAL DATA:  56 year old male with neurologic deficit. Right temporal lobe hemorrhage. EXAM: MRI HEAD WITHOUT CONTRAST AND WITH MRA HEAD WITHOUT CONTRAST TECHNIQUE: Multiplanar, multi-echo pulse sequences of the brain and surrounding structures were acquired without and with intravenous contrast. Angiographic images of the Circle of Willis  were acquired using MRA technique without intravenous contrast. COMPARISON:  Head CTs 08/25/2021 and 08/24/2021. FINDINGS: MRI HEAD FINDINGS Brain: T1 isointense mix signal intensity T2 intra-axial hemorrhage in the right temporal lobe  fusiform gyrus encompasses about 30 x 11 x 14 mm (AP by transverse by CC) for an estimated blood volume of 2-3 mL. Mild regional edema. No significant regional mass effect. No abnormal enhancement identified. No intraventricular or extra-axial extension. No intraventricular blood or debris identified. Susceptibility artifact on DWI. No larger area of restricted diffusion. Numerous chronic microhemorrhages in the brain, most severe throughout the brainstem and cerebellum. But multiple hemispheric foci of chronic hemosiderin also, including involving a 13 mm area at the junction of the right posterior parietal and occipital lobes on series 11, image 40. And contralateral posterior left temporal lobe patchy edema or encephalomalacia also appears associated with chronic hemosiderin on the SWI (although susceptibility artifact affects detail). No superimposed midline shift, mass effect, evidence of mass lesion, ventriculomegaly, extra-axial collection. Cervicomedullary junction and pituitary are within normal limits. No abnormal enhancement identified. No dural thickening. Chronic lacunar infarct of the posterior right external capsule and moderate for age other scattered bilateral white matter T2 and FLAIR hyperintensity. Moderate T2 and FLAIR heterogeneity throughout the brainstem. Vascular: Major intracranial vascular flow voids are preserved. Distal right vertebral artery appears dominant. The major dural venous sinuses are enhancing and appear to be patent. Skull and upper cervical spine: Negative visible cervical spine. Visualized bone marrow signal is within normal limits. Sinuses/Orbits: Negative orbits. Mild to moderate paranasal sinus mucosal thickening. Other: Mastoids are clear. Visible internal auditory structures appear normal. MRA HEAD FINDINGS Anterior circulation: Antegrade flow in both ICA siphons. Mild bilateral ICA tortuosity. No ICA stenosis. Normal left posterior communicating artery and ophthalmic  artery origins. Patent carotid termini. Normal MCA and ACA origins. Mildly dominant right ACA A1. Normal anterior communicating artery. Visible bilateral MCA and ACA branches are within normal limits. Posterior circulation: Antegrade flow in the posterior circulation with dominant distal right vertebral artery. Patent vertebrobasilar junction. Mild basilar artery irregularity. No distal vertebral or basilar stenosis. SCA and right PCA origins are normal. Fetal type left PCA origin. Diminutive or absent right posterior communicating artery. Bilateral PCA branches are within normal limits. No abnormal flow signal about the right temporal lobe hemorrhage. Anatomic variants: Fetal type left PCA origin. Dominant right vertebral artery and right ACA A1. IMPRESSION: 1. Small acute intra-axial hemorrhage in the right temporal lobe fusiform gyrus, estimated blood volume 2-3 mL. Mild regional edema with no mass effect. No abnormal enhancement, extra-axial extension, or other complicating features. 2. Numerous chronic microhemorrhages in the brain. These are most severe in the brainstem and cerebellum, but also scattered in the cerebral hemispheres with evidence of a previous right parieto-occipital hemorrhage. And there may be mild left temporal lobe gyral edema associated with some of the chronic blood products. Advanced for age but comparatively minor other evidence of chronic small vessel disease. 3. This constellation is suspicious for Amyloid Angiopathy, despite the relatively young age group. Is there history of renal failure or other risk factor for amyloidosis? Otherwise assume advanced chronic hypertension or other advanced small vessel disease. 4. Negative intracranial MRA. Electronically Signed   By: Genevie Ann M.D.   On: 08/25/2021 09:42   CT HEAD CODE STROKE WO CONTRAST  Result Date: 08/24/2021 CLINICAL DATA:  Code stroke. EXAM: CT HEAD WITHOUT CONTRAST TECHNIQUE: Contiguous axial images were obtained from the  base of the skull through the vertex  without intravenous contrast. COMPARISON:  None. FINDINGS: Brain: Acute intraparenchymal hemorrhage within the posterior right temporal lobe measuring 2.6 x 0.7 x 1.0 cm (volume approximately 0.9 mL). No intraventricular extension. No mass effect. The size and configuration of the ventricles and extra-axial CSF spaces are normal. There is hypoattenuation of the periventricular white matter, most commonly indicating chronic ischemic microangiopathy. Vascular: No abnormal hyperdensity of the major intracranial arteries or dural venous sinuses. No intracranial atherosclerosis. Skull: The visualized skull base, calvarium and extracranial soft tissues are normal. Sinuses/Orbits: No fluid levels or advanced mucosal thickening of the visualized paranasal sinuses. No mastoid or middle ear effusion. The orbits are normal. IMPRESSION: Acute intraparenchymal hemorrhage within the posterior right temporal lobe measuring 2.6 x 0.7 x 1.0 cm (volume approximately 0.9 mL). Critical Value/emergent results were called by telephone at the time of interpretation on 08/24/2021 at 8:25 pm to provider [facility], who verbally acknowledged these results. Electronically Signed   By: Ulyses Jarred M.D.   On: 08/24/2021 20:26    PHYSICAL EXAM  Physical Exam  Constitutional: Appears well-developed and well-nourished.  Psych: Affect appropriate to situation Cardiovascular: Normal rate and regular rhythm.  Respiratory: Effort normal, non-labored breathing  Neuro: Mental Status: Patient is awake, alert, oriented to person, place, month, year, and situation. Patient is able to give a clear and coherent history. No signs of aphasia or neglect Cranial Nerves: II: Visual Fields are full. Pupils are equal, round, and reactive to light.   III,IV, VI: EOMI without ptosis or diploplia.  V: Facial sensation is symmetric to temperature VII: Facial movement is symmetric resting and smiling VIII:  Hearing is intact to voice X: Palate elevates symmetrically XI: Shoulder shrug is symmetric. XII: Tongue protrudes midline without atrophy or fasciculations.  Motor: Tone is normal. Bulk is normal. 5/5 strength was present in all four extremities.  Sensory: Sensation is symmetric to light touch and temperature in the arms and legs. No extinction to DSS present.  Deep Tendon Reflexes: 2+ and symmetric in the biceps and patellae.  Coordination: FNF and HKS are intact bilaterally  ASSESSMENT/PLAN Mr. Jesus Warren is a 56 y.o. male with history of erectile dysfunction, hyperlipidemia, low testosterone who presents with 3 day history of headaches and since 0900 on 08/24/21, has had confusion with staggering gait, and slurred speech. Family called EMS and he was brought in as a stroke code. 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 Patient reports that he takes a significant amount of ibuprofen and acetaminophen for headaches. He used to drink alcohol everyday until christmas. Used to drink about 4 shots of vodka. Also sniffs tobacco daily because that gives him a nicotine high. Has been struggling with a lot of mental issues but took himself off the meds and has not followed up with his PCP since 2017. Wife states that she has noticed some memory issues in the the recent past. According to notes patient was confused and agitated in the ED.  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 Strict blood pressure management with scheduled medications, cleviprex, and PRNs. Awaiting c-spine MRI, 2-D echo, and carotid doppler. PT/OT/ST have signed off.   ICH:  right posterior temporal lobe small ICH, atypical location, given numerous peripheral and posterior MCBs on  MRI, suspicious for CAA Code Stroke- small R  posterior temporal lobe ICH with no intraventricular extension MRI-  Small acute hemorrhage in the right temporal lobe . Numerous chronic microhemorrhages in the brain. Most severe in the brainstem and cerebellum, but also scattered in the cerebral hemispheres with evidence of a previous right parieto-occipital hemorrhage.  MRA- negative intracranial MRA CT repeat due to headache -no change, stable hematoma Recommend CTA or cerebral angiogram as outpatient after ICH resolution to rule out AVM Carotid Doppler  pending 2D Echo pending LDL 95 HgbA1c 5.4 VTE prophylaxis - SCDs No antithrombotic prior to admission, now on No antithrombotic due to [place] possible CAA Therapy recommendations:  No follow up  Disposition:  Pending  Posterior headache and neck pain Likely cervical radiculopathy No pain at occipital nerve compression Tramadol as needed Tylenol for pain management, avoid NSAIDS MRI C-spine pending  Hypertension Home meds:  None BP 120-140 Cleviprex IVF Labetolol PRN, Hydralazine PRN Start Norvasc, lisinopril schedule  Hyperlipidemia Home meds:  none, resumed in hospital LDL 95, goal < 70 No statin recommended given possible CAA  Other Stroke Risk Factors Cigarette smoker, advised to stop smoking ETOH use, alcohol level 296, advised to drink no more than 2 drink(s) a day Obesity, Body mass index is 30.04 kg/m., BMI >/= 30 associated with increased stroke risk, recommend weight loss, diet and exercise as appropriate  Headaches Uses tylenol and ibuprofen consistently at home  Other Active Problems Cognitive Impairment SLP evaluation  Family has noticed increased difficulty with memory and attention over the last 6 months.  Dresser Mental Status Examination patient scored 59/30  Hospital day # 1  Patient seen and examined by NP/APP with MD. MD to update note as needed.   Janine Ores, DNP, FNP-BC [facility] Pager: [phone]  ATTENDING NOTE: I reviewed above note and agree with the assessment and plan. Pt was seen and examined.   Wife at bedside.  Patient still complaining of headache but more at posterior head and neck, concerning for cervical spine radiculopathy, MRI C-spine pending.  Neuro examination intact.  However CT and MRI showed right MCA/PCA location small ICH.  However, MRI also showed numerous micro bleeds especially at cerebellum and brainstem, peripheral location, concerning for possible CAA.  Per patient and family, patient BP generally controlled well at home with intermittent high blood pressure but no chronic uncontrolled hypertension, this makes CAA more likely.  Recommend strict BP control less than 160 for now, however long-term BP goal less than 140.  Started on Norvasc and lisinopril to taper off Cleviprex.  Also recommend repeat MRI or cerebral angiogram once ICH resolved to further rule out AVM as a potential cause.  Limit alcohol and smoking cessation education provided.  Close neuro monitoring.  For detailed assessment and plan, please refer to above as I have made changes wherever appropriate.   This patient is critically ill due to right temporal ICH, possible CAA, headache and at significant risk of neurological worsening, death form hematoma expansion, recurrent hemorrhage. This patient's care requires constant monitoring of vital signs, hemodynamics, respiratory and cardiac monitoring, review of multiple databases, neurological assessment, discussion with family, other specialists and medical decision making of high complexity. I spent 45 minutes of neurocritical care time in the care of this patient. I had long discussion with wife and patient at bedside, updated pt current condition, treatment plan and potential prognosis, and answered all the questions.  They expressed understanding and appreciation.    Rosalin Hawking, MD PhD Stroke  Neurology 08/25/2021 7:28  PM    To contact Stroke Continuity provider, please refer to [email or web address] After hours, contact General Neurology

## 2021-08-25 NOTE — Progress Notes (Signed)
°  Echocardiogram 2D Echocardiogram has been performed.  Merrie Roof F 08/25/2021, 2:37 PM

## 2021-08-26 ENCOUNTER — Inpatient Hospital Stay (HOSPITAL_COMMUNITY): Payer: 59

## 2021-08-26 DIAGNOSIS — I610 Nontraumatic intracerebral hemorrhage in hemisphere, subcortical: Secondary | ICD-10-CM

## 2021-08-26 DIAGNOSIS — F17200 Nicotine dependence, unspecified, uncomplicated: Secondary | ICD-10-CM

## 2021-08-26 DIAGNOSIS — F1010 Alcohol abuse, uncomplicated: Secondary | ICD-10-CM

## 2021-08-26 LAB — CBC
HCT: 37.8 % — ABNORMAL LOW (ref 39.0–52.0)
Hemoglobin: 13.3 g/dL (ref 13.0–17.0)
MCH: 32.1 pg (ref 26.0–34.0)
MCHC: 35.2 g/dL (ref 30.0–36.0)
MCV: 91.3 fL (ref 80.0–100.0)
Platelets: 120 10*3/uL — ABNORMAL LOW (ref 150–400)
RBC: 4.14 MIL/uL — ABNORMAL LOW (ref 4.22–5.81)
RDW: 12.4 % (ref 11.5–15.5)
WBC: 10.6 10*3/uL — ABNORMAL HIGH (ref 4.0–10.5)
nRBC: 0 % (ref 0.0–0.2)

## 2021-08-26 LAB — BASIC METABOLIC PANEL
Anion gap: 10 (ref 5–15)
BUN: 10 mg/dL (ref 6–20)
CO2: 27 mmol/L (ref 22–32)
Calcium: 8.8 mg/dL — ABNORMAL LOW (ref 8.9–10.3)
Chloride: 102 mmol/L (ref 98–111)
Creatinine, Ser: 0.95 mg/dL (ref 0.61–1.24)
GFR, Estimated: >60 mL/min (ref >60)
Glucose, Bld: 106 mg/dL — ABNORMAL HIGH (ref 70–99)
Potassium: 2.5 mmol/L — CL (ref 3.5–5.1)
Sodium: 139 mmol/L (ref 135–145)

## 2021-08-26 LAB — PHOSPHORUS: Phosphorus: 3.5 mg/dL (ref 2.5–4.6)

## 2021-08-26 LAB — MAGNESIUM: Magnesium: 1.9 mg/dL (ref 1.7–2.4)

## 2021-08-26 IMAGING — MR MR CERVICAL SPINE W/O CM
4 of 5 series · 19 of 48 positions shown · non-contrast
Comparison: None.

CLINICAL DATA: Neck pain, chronic

EXAM:
MRI CERVICAL SPINE WITHOUT CONTRAST
TECHNIQUE: Multiplanar, multisequence MR imaging of the cervical spine was
performed. No intravenous contrast was administered.

[Series 2: T2 · sagittal · 3.0mm · 0.43mm/px · 6 of 17 slices shown (1 of 2)]
[im 1/17]
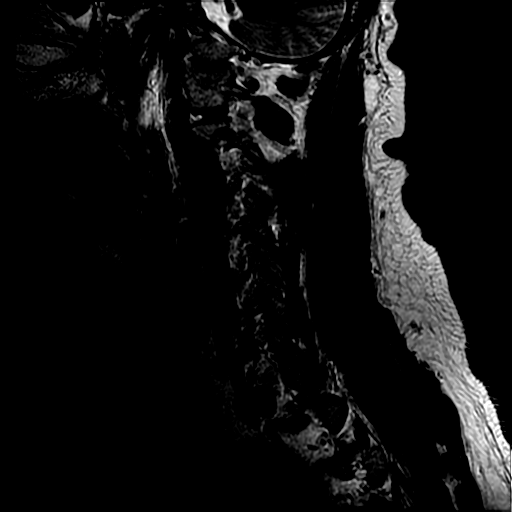
[im 4/17]
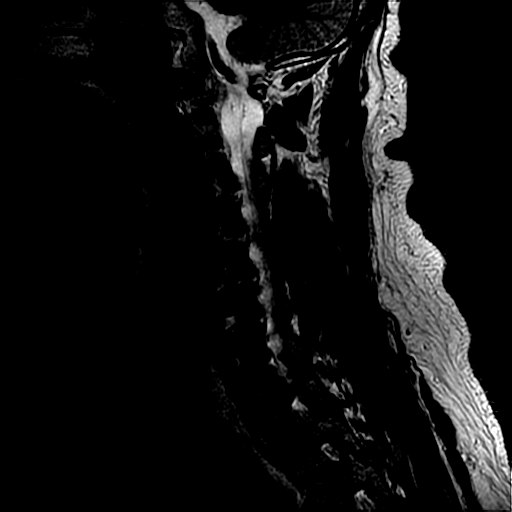
[im 7/17]
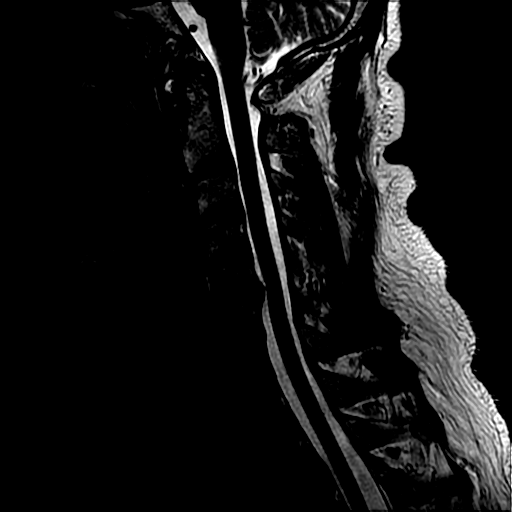
[im 10/17]
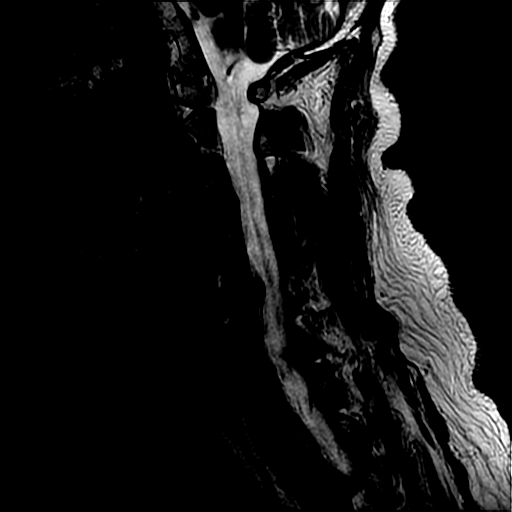
[im 13/17]
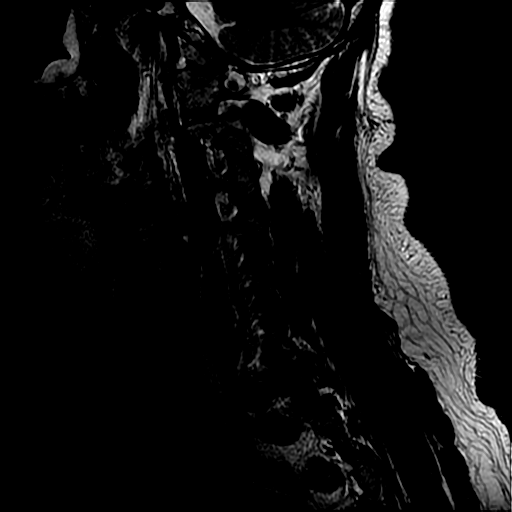
[im 17/17]
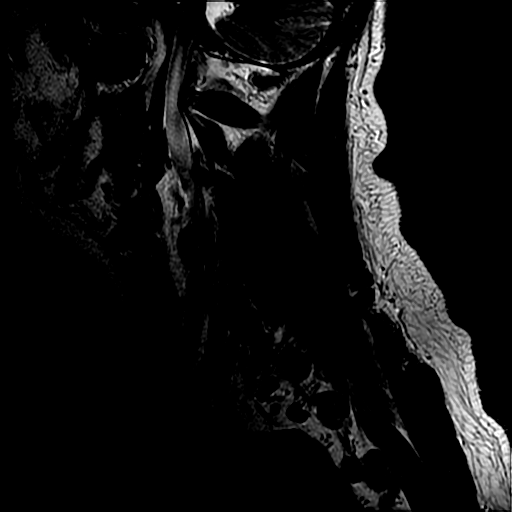

[Series 3: T1 · sagittal · 3.0mm · 0.43mm/px · 3 of 17 slices shown]
[im 4/17]
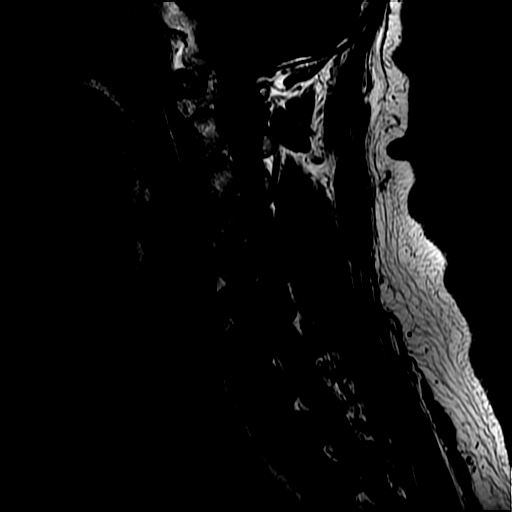
[im 10/17]
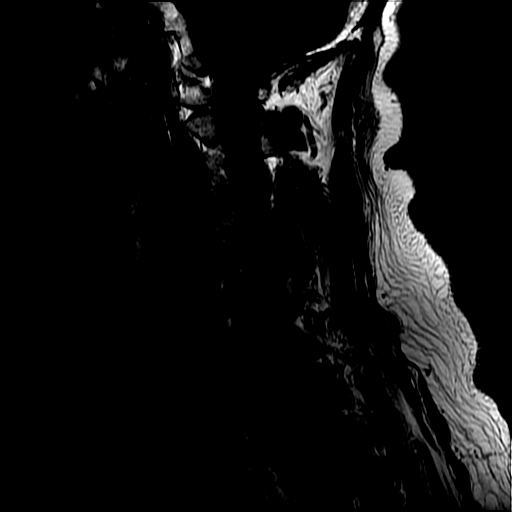
[im 17/17]
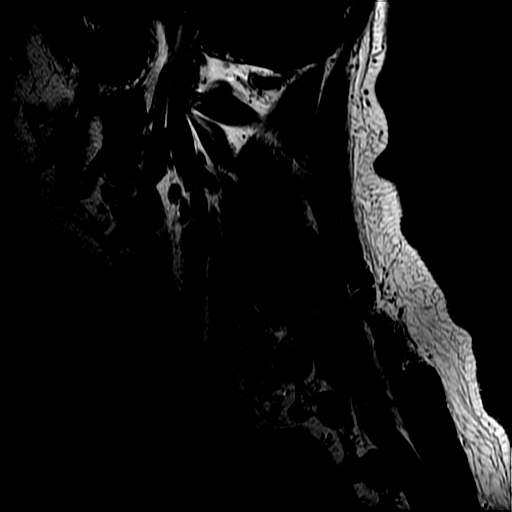

[Series 4: sag ir · sagittal · 3.0mm · 0.43mm/px · 3 of 17 slices shown]
[im 4/17]
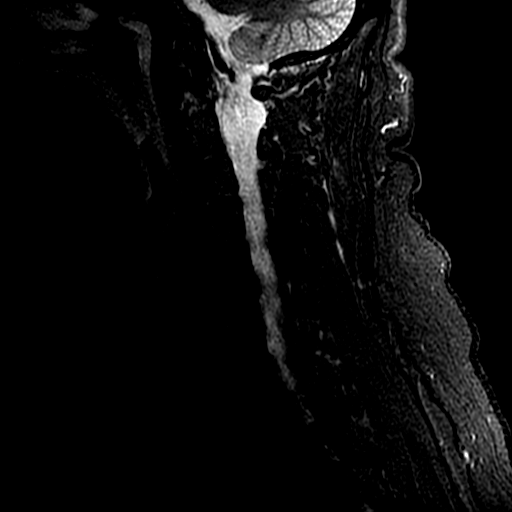
[im 10/17]
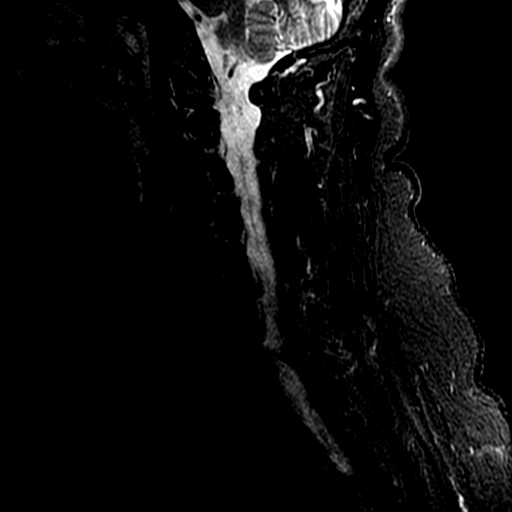
[im 17/17]
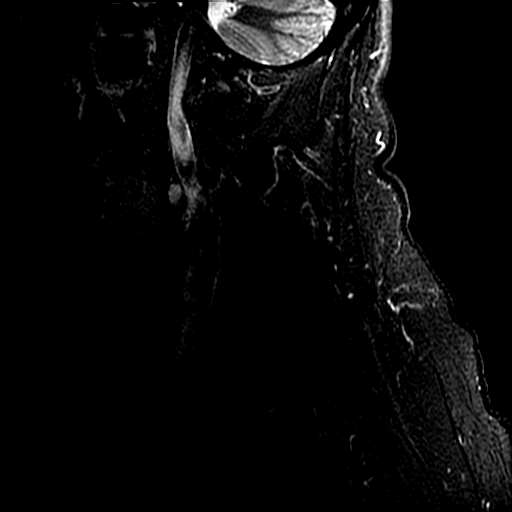

[Series 6: T2 · axial · 3.0mm · 0.39mm/px · z∈[-41,+72]mm · 7 of 40 slices shown (2 of 2)]
[im 1/40]
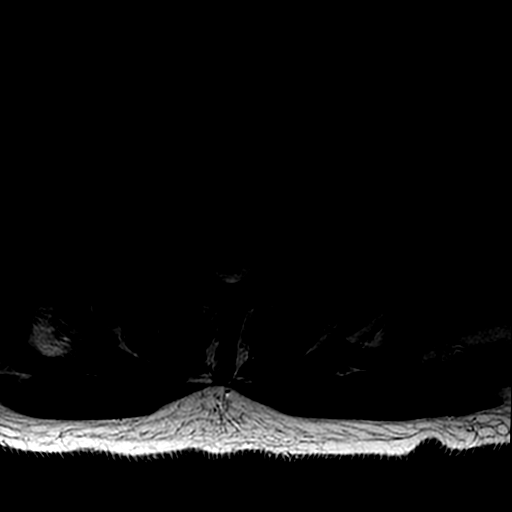
[im 6/40]
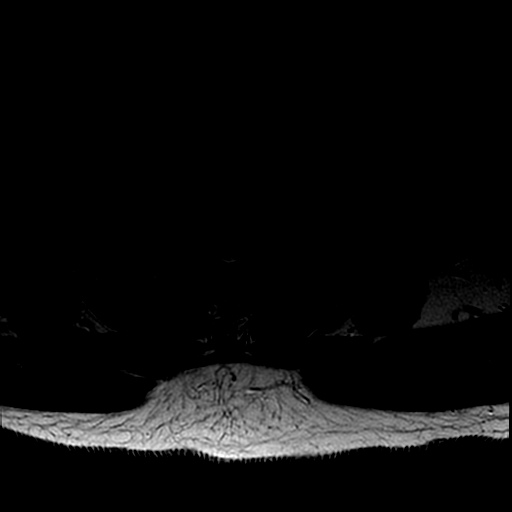
[im 12/40]
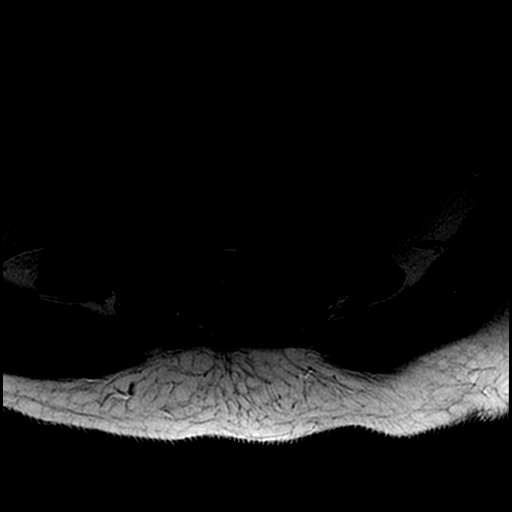
[im 17/40]
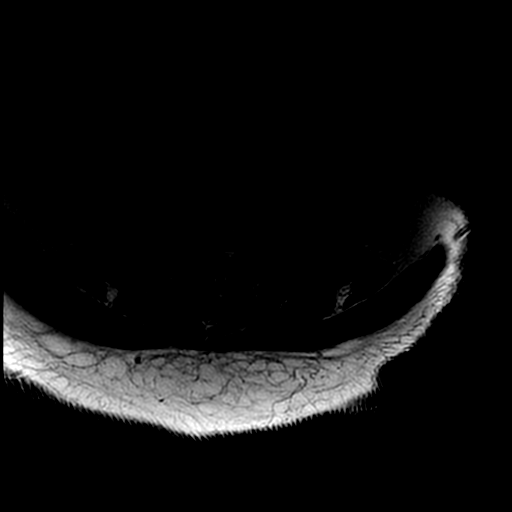
[im 20/40]
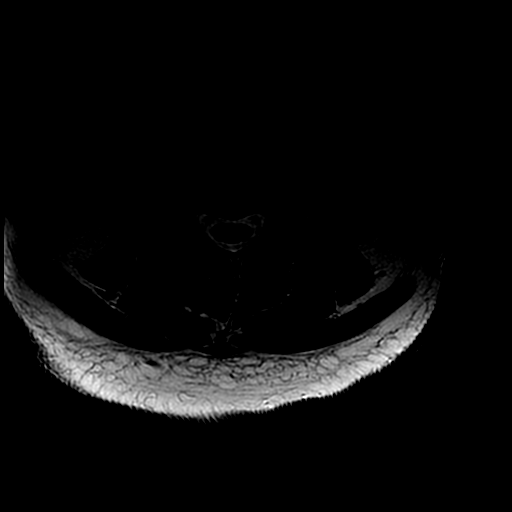
[im 23/40]
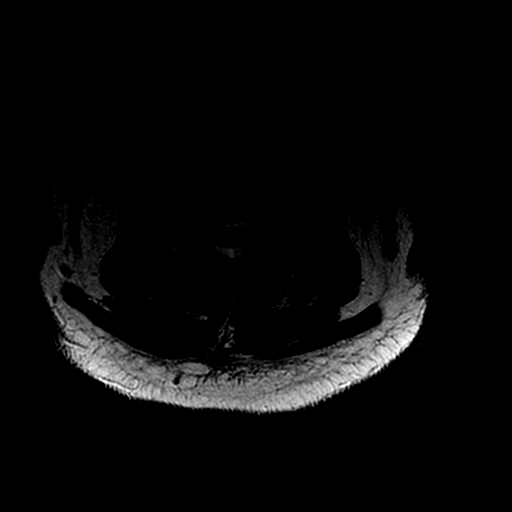
[im 34/40]
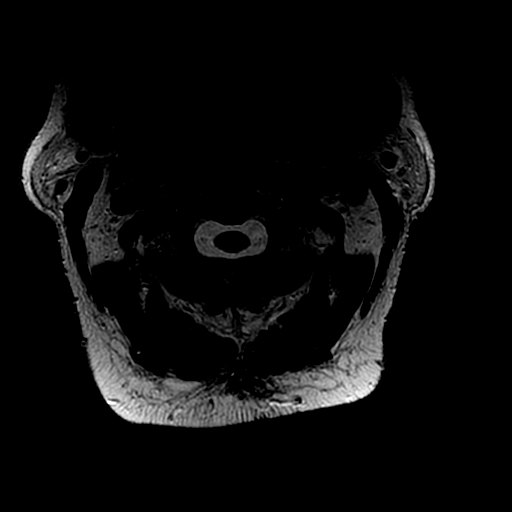

[19 of 48 positions shown; findings below may reference images not displayed]

FINDINGS: Alignment: No significant listhesis.

Vertebrae: Vertebral body heights are maintained. There is no marrow
edema. No suspicious osseous lesion.

Cord: Normal caliber and signal.

Posterior Fossa, vertebral arteries, paraspinal tissues:
Unremarkable.

Disc levels:

C2-C3:  No stenosis.

C3-C4:  No stenosis.

C4-C5:  No stenosis.

C5-C6: Disc bulge with endplate osteophytes. Mild canal stenosis. No
right foraminal stenosis. Minimal left foraminal stenosis.

C6-C7:  No stenosis.

C7-T1: Left facet arthropathy.  No stenosis.
IMPRESSION: Degenerative changes primarily at C5-C6. No high-grade stenosis.
There is also left facet arthropathy at C7-T1.

## 2021-08-26 MED ORDER — POTASSIUM CHLORIDE CRYS ER 20 MEQ PO TBCR
20.0000 meq | EXTENDED_RELEASE_TABLET | Freq: Once | ORAL | Status: AC
  Administered 2021-08-26: 20 meq via ORAL
  Filled 2021-08-26: qty 1

## 2021-08-26 MED ORDER — THIAMINE HCL 100 MG PO TABS
100.0000 mg | ORAL_TABLET | Freq: Every day | ORAL | Status: DC
  Administered 2021-08-26 – 2021-08-27 (×2): 100 mg via ORAL
  Filled 2021-08-26 (×2): qty 1

## 2021-08-26 MED ORDER — POTASSIUM CHLORIDE 10 MEQ/100ML IV SOLN
10.0000 meq | INTRAVENOUS | Status: AC
  Administered 2021-08-26 (×4): 10 meq via INTRAVENOUS
  Filled 2021-08-26 (×4): qty 100

## 2021-08-26 MED ORDER — SODIUM CHLORIDE 0.9 % IV SOLN: INTRAVENOUS | Status: DC | PRN

## 2021-08-26 MED ORDER — HYDRALAZINE HCL 50 MG PO TABS
50.0000 mg | ORAL_TABLET | Freq: Three times a day (TID) | ORAL | Status: DC
  Administered 2021-08-26 – 2021-08-27 (×4): 50 mg via ORAL
  Filled 2021-08-26 (×4): qty 1

## 2021-08-26 MED ORDER — TIZANIDINE HCL 4 MG PO TABS
4.0000 mg | ORAL_TABLET | Freq: Three times a day (TID) | ORAL | Status: DC | PRN
  Administered 2021-08-26 – 2021-08-27 (×2): 4 mg via ORAL
  Filled 2021-08-26 (×3): qty 1

## 2021-08-26 MED ORDER — ADULT MULTIVITAMIN W/MINERALS CH
1.0000 | ORAL_TABLET | Freq: Every day | ORAL | Status: DC
  Administered 2021-08-26 – 2021-08-27 (×2): 1 via ORAL
  Filled 2021-08-26 (×2): qty 1

## 2021-08-26 MED ORDER — POTASSIUM CHLORIDE CRYS ER 20 MEQ PO TBCR
40.0000 meq | EXTENDED_RELEASE_TABLET | Freq: Once | ORAL | Status: AC
  Administered 2021-08-26: 40 meq via ORAL
  Filled 2021-08-26: qty 2

## 2021-08-26 MED ORDER — MAGNESIUM SULFATE IN D5W 1-5 GM/100ML-% IV SOLN
1.0000 g | Freq: Once | INTRAVENOUS | Status: AC
  Administered 2021-08-26: 1 g via INTRAVENOUS
  Filled 2021-08-26: qty 100

## 2021-08-26 MED ORDER — FOLIC ACID 1 MG PO TABS
1.0000 mg | ORAL_TABLET | Freq: Every day | ORAL | Status: DC
  Administered 2021-08-26 – 2021-08-27 (×2): 1 mg via ORAL
  Filled 2021-08-26 (×3): qty 1

## 2021-08-26 NOTE — Progress Notes (Addendum)
STROKE TEAM PROGRESS NOTE    INTERVAL HISTORY No family at the bedside. Patient seen in room. He has been off the cleviprex, but is borderline hypertensive. Consider CTA and cerebral angiogram after ICH resolves.  Start hydralazine 50mg  q8 for tighter BP control and transfer out of ICU.   Vitals:   08/26/21 0600 08/26/21 0700 08/26/21 0710 08/26/21 0800  BP: (!) 153/92 (!) 160/91  (!) 160/92  Pulse: (!) 57 60 (!) 58 64  Resp: 11 20 14 14   Temp:      TempSrc:      SpO2: 94% 94% 92% 93%  Weight:      Height:       CBC:  Recent Labs  Lab 08/24/21 2007 08/24/21 2008 08/25/21 0942 08/26/21 0427  WBC 8.9  --  9.3 10.6*  NEUTROABS 5.0  --   --   --   HGB 15.0   < > 15.4 13.3  HCT 42.1   < > 42.2 37.8*  MCV 91.1  --  90.0 91.3  PLT 125*  --  153 120*   < > = values in this interval not displayed.    Basic Metabolic Panel:  Recent Labs  Lab 08/25/21 0942 08/26/21 0427  NA 141 139  K 3.2* 2.5*  CL 104 102  CO2 22 27  GLUCOSE 168* 106*  BUN 10 10  CREATININE 1.06 0.95  CALCIUM 9.2 8.8*    Lipid Panel:  Recent Labs  Lab 08/25/21 0942  CHOL 209*  TRIG 298*  HDL 54  CHOLHDL 3.9  VLDL 60*  LDLCALC 95   HgbA1c:  Recent Labs  Lab 08/25/21 0942  HGBA1C 5.4    Urine Drug Screen:  Recent Labs  Lab 08/25/21 0106  LABOPIA NONE DETECTED  COCAINSCRNUR NONE DETECTED  LABBENZ NONE DETECTED  AMPHETMU NONE DETECTED  THCU NONE DETECTED  LABBARB NONE DETECTED     Alcohol Level  Recent Labs  Lab 08/24/21 2024  ETH 296*     IMAGING past 24 hours CT HEAD WO CONTRAST (5MM)  Result Date: 08/25/2021 CLINICAL DATA:  Hemorrhagic stroke. EXAM: CT HEAD WITHOUT CONTRAST TECHNIQUE: Contiguous axial images were obtained from the base of the skull through the vertex without intravenous contrast. COMPARISON:  Head CT earlier today FINDINGS: Brain: Right temporal lobe periventricular hemorrhage measures 2.4 x 0.9 x 1.8 cm (volume = 2 cm^3), minimally increased in volume from  prior exam, although subjectively not significantly increased in size. Minimal surrounding edema. No new areas of hemorrhage. Stable brain volume, no hydrocephalus. Again seen periventricular chronic small vessel ischemia. Vascular: Atherosclerosis of skullbase vasculature without hyperdense vessel or abnormal calcification. Skull: No fracture or focal lesion. Sinuses/Orbits: Mucosal thickening throughout the paranasal sinuses. Mastoid air cells are clear. No acute orbital findings. Other: None. IMPRESSION: 1. Right temporal lobe hemorrhage is subjectively not significantly increased in size. Minimal surrounding edema. No new areas of hemorrhage. 2. No new hemorrhage. Electronically Signed   By: Keith Rake M.D.   On: 08/25/2021 17:02   MR ANGIO HEAD WO CONTRAST  Result Date: 08/25/2021 CLINICAL DATA:  56 year old male with neurologic deficit. Right temporal lobe hemorrhage. EXAM: MRI HEAD WITHOUT CONTRAST AND WITH MRA HEAD WITHOUT CONTRAST TECHNIQUE: Multiplanar, multi-echo pulse sequences of the brain and surrounding structures were acquired without and with intravenous contrast. Angiographic images of the Circle of Willis were acquired using MRA technique without intravenous contrast. COMPARISON:  Head CTs 08/25/2021 and 08/24/2021. FINDINGS: MRI HEAD FINDINGS Brain: T1 isointense mix  signal intensity T2 intra-axial hemorrhage in the right temporal lobe fusiform gyrus encompasses about 30 x 11 x 14 mm (AP by transverse by CC) for an estimated blood volume of 2-3 mL. Mild regional edema. No significant regional mass effect. No abnormal enhancement identified. No intraventricular or extra-axial extension. No intraventricular blood or debris identified. Susceptibility artifact on DWI. No larger area of restricted diffusion. Numerous chronic microhemorrhages in the brain, most severe throughout the brainstem and cerebellum. But multiple hemispheric foci of chronic hemosiderin also, including involving a 13 mm  area at the junction of the right posterior parietal and occipital lobes on series 11, image 40. And contralateral posterior left temporal lobe patchy edema or encephalomalacia also appears associated with chronic hemosiderin on the SWI (although susceptibility artifact affects detail). No superimposed midline shift, mass effect, evidence of mass lesion, ventriculomegaly, extra-axial collection. Cervicomedullary junction and pituitary are within normal limits. No abnormal enhancement identified. No dural thickening. Chronic lacunar infarct of the posterior right external capsule and moderate for age other scattered bilateral white matter T2 and FLAIR hyperintensity. Moderate T2 and FLAIR heterogeneity throughout the brainstem. Vascular: Major intracranial vascular flow voids are preserved. Distal right vertebral artery appears dominant. The major dural venous sinuses are enhancing and appear to be patent. Skull and upper cervical spine: Negative visible cervical spine. Visualized bone marrow signal is within normal limits. Sinuses/Orbits: Negative orbits. Mild to moderate paranasal sinus mucosal thickening. Other: Mastoids are clear. Visible internal auditory structures appear normal. MRA HEAD FINDINGS Anterior circulation: Antegrade flow in both ICA siphons. Mild bilateral ICA tortuosity. No ICA stenosis. Normal left posterior communicating artery and ophthalmic artery origins. Patent carotid termini. Normal MCA and ACA origins. Mildly dominant right ACA A1. Normal anterior communicating artery. Visible bilateral MCA and ACA branches are within normal limits. Posterior circulation: Antegrade flow in the posterior circulation with dominant distal right vertebral artery. Patent vertebrobasilar junction. Mild basilar artery irregularity. No distal vertebral or basilar stenosis. SCA and right PCA origins are normal. Fetal type left PCA origin. Diminutive or absent right posterior communicating artery. Bilateral PCA  branches are within normal limits. No abnormal flow signal about the right temporal lobe hemorrhage. Anatomic variants: Fetal type left PCA origin. Dominant right vertebral artery and right ACA A1. IMPRESSION: 1. Small acute intra-axial hemorrhage in the right temporal lobe fusiform gyrus, estimated blood volume 2-3 mL. Mild regional edema with no mass effect. No abnormal enhancement, extra-axial extension, or other complicating features. 2. Numerous chronic microhemorrhages in the brain. These are most severe in the brainstem and cerebellum, but also scattered in the cerebral hemispheres with evidence of a previous right parieto-occipital hemorrhage. And there may be mild left temporal lobe gyral edema associated with some of the chronic blood products. Advanced for age but comparatively minor other evidence of chronic small vessel disease. 3. This constellation is suspicious for Amyloid Angiopathy, despite the relatively young age group. Is there history of renal failure or other risk factor for amyloidosis? Otherwise assume advanced chronic hypertension or other advanced small vessel disease. 4. Negative intracranial MRA. Electronically Signed   By: Genevie Ann M.D.   On: 08/25/2021 09:42   MR BRAIN W WO CONTRAST  Result Date: 08/25/2021 CLINICAL DATA:  56 year old male with neurologic deficit. Right temporal lobe hemorrhage. EXAM: MRI HEAD WITHOUT CONTRAST AND WITH MRA HEAD WITHOUT CONTRAST TECHNIQUE: Multiplanar, multi-echo pulse sequences of the brain and surrounding structures were acquired without and with intravenous contrast. Angiographic images of the Circle of Willis were acquired  using MRA technique without intravenous contrast. COMPARISON:  Head CTs 08/25/2021 and 08/24/2021. FINDINGS: MRI HEAD FINDINGS Brain: T1 isointense mix signal intensity T2 intra-axial hemorrhage in the right temporal lobe fusiform gyrus encompasses about 30 x 11 x 14 mm (AP by transverse by CC) for an estimated blood volume of  2-3 mL. Mild regional edema. No significant regional mass effect. No abnormal enhancement identified. No intraventricular or extra-axial extension. No intraventricular blood or debris identified. Susceptibility artifact on DWI. No larger area of restricted diffusion. Numerous chronic microhemorrhages in the brain, most severe throughout the brainstem and cerebellum. But multiple hemispheric foci of chronic hemosiderin also, including involving a 13 mm area at the junction of the right posterior parietal and occipital lobes on series 11, image 40. And contralateral posterior left temporal lobe patchy edema or encephalomalacia also appears associated with chronic hemosiderin on the SWI (although susceptibility artifact affects detail). No superimposed midline shift, mass effect, evidence of mass lesion, ventriculomegaly, extra-axial collection. Cervicomedullary junction and pituitary are within normal limits. No abnormal enhancement identified. No dural thickening. Chronic lacunar infarct of the posterior right external capsule and moderate for age other scattered bilateral white matter T2 and FLAIR hyperintensity. Moderate T2 and FLAIR heterogeneity throughout the brainstem. Vascular: Major intracranial vascular flow voids are preserved. Distal right vertebral artery appears dominant. The major dural venous sinuses are enhancing and appear to be patent. Skull and upper cervical spine: Negative visible cervical spine. Visualized bone marrow signal is within normal limits. Sinuses/Orbits: Negative orbits. Mild to moderate paranasal sinus mucosal thickening. Other: Mastoids are clear. Visible internal auditory structures appear normal. MRA HEAD FINDINGS Anterior circulation: Antegrade flow in both ICA siphons. Mild bilateral ICA tortuosity. No ICA stenosis. Normal left posterior communicating artery and ophthalmic artery origins. Patent carotid termini. Normal MCA and ACA origins. Mildly dominant right ACA A1. Normal  anterior communicating artery. Visible bilateral MCA and ACA branches are within normal limits. Posterior circulation: Antegrade flow in the posterior circulation with dominant distal right vertebral artery. Patent vertebrobasilar junction. Mild basilar artery irregularity. No distal vertebral or basilar stenosis. SCA and right PCA origins are normal. Fetal type left PCA origin. Diminutive or absent right posterior communicating artery. Bilateral PCA branches are within normal limits. No abnormal flow signal about the right temporal lobe hemorrhage. Anatomic variants: Fetal type left PCA origin. Dominant right vertebral artery and right ACA A1. IMPRESSION: 1. Small acute intra-axial hemorrhage in the right temporal lobe fusiform gyrus, estimated blood volume 2-3 mL. Mild regional edema with no mass effect. No abnormal enhancement, extra-axial extension, or other complicating features. 2. Numerous chronic microhemorrhages in the brain. These are most severe in the brainstem and cerebellum, but also scattered in the cerebral hemispheres with evidence of a previous right parieto-occipital hemorrhage. And there may be mild left temporal lobe gyral edema associated with some of the chronic blood products. Advanced for age but comparatively minor other evidence of chronic small vessel disease. 3. This constellation is suspicious for Amyloid Angiopathy, despite the relatively young age group. Is there history of renal failure or other risk factor for amyloidosis? Otherwise assume advanced chronic hypertension or other advanced small vessel disease. 4. Negative intracranial MRA. Electronically Signed   By: Genevie Ann M.D.   On: 08/25/2021 09:42   ECHOCARDIOGRAM COMPLETE  Result Date: 08/25/2021    ECHOCARDIOGRAM REPORT   Patient Name:   Jesus Warren Date of Exam: 08/25/2021 Medical Rec #:  [id number]      Height:  74.0 in Accession #:    [id number]     Weight:       234.0 lb Date of Birth:  1966/08/15      BSA:           2.323 m Patient Age:    56 years       BP:           164/110 mmHg Patient Gender: M              HR:           93 bpm. Exam Location:  Inpatient Procedure: 2D Echo, Cardiac Doppler and Color Doppler Indications:    Stroke  History:        Patient has no prior history of Echocardiogram examinations.                 Risk Factors:Hypertension.  Sonographer:    Merrie Roof RDCS Referring Phys: [id number] [place]  1. Left ventricular ejection fraction, by estimation, is 50 to 55%. The left ventricle has low normal function. The left ventricular internal cavity size was severely dilated. There is mild left ventricular hypertrophy. Left ventricular diastolic parameters were normal.  2. Right ventricular systolic function is normal. The right ventricular size is normal. There is normal pulmonary artery systolic pressure.  3. The mitral valve is normal in structure. Mild mitral valve regurgitation.  4. The aortic valve is tricuspid. Aortic valve regurgitation is mild to moderate.  5. Sinotubular junction is 36 mm. Aortic dilatation noted. There is severe dilatation of the aortic root, measuring 50 mm. There is mild dilatation of the ascending aorta, measuring 39 mm.  6. The inferior vena cava is dilated in size with <50% respiratory variability, suggesting right atrial pressure of 15 mmHg. FINDINGS  Left Ventricle: Left ventricular ejection fraction, by estimation, is 50 to 55%. The left ventricle has low normal function. The left ventricular internal cavity size was severely dilated. There is mild left ventricular hypertrophy. Left ventricular diastolic parameters were normal. Right Ventricle: The right ventricular size is normal. Right vetricular wall thickness was not assessed. Right ventricular systolic function is normal. There is normal pulmonary artery systolic pressure. The tricuspid regurgitant velocity is 2.74 m/s, and with an assumed right atrial pressure of 3 mmHg, the estimated right  ventricular systolic pressure is 00.9 mmHg. Left Atrium: Left atrial size was normal in size. Right Atrium: Right atrial size was normal in size. Pericardium: There is no evidence of pericardial effusion. Mitral Valve: The mitral valve is normal in structure. Mild mitral valve regurgitation. Tricuspid Valve: The tricuspid valve is normal in structure. Tricuspid valve regurgitation is trivial. Aortic Valve: The aortic valve is tricuspid. Aortic valve regurgitation is mild to moderate. Aortic valve mean gradient measures 6.0 mmHg. Aortic valve peak gradient measures 10.2 mmHg. Aortic valve area, by VTI measures 3.91 cm. Pulmonic Valve: The pulmonic valve was normal in structure. Pulmonic valve regurgitation is mild. Aorta: Sinotubular junction is 36 mm. Aortic dilatation noted. There is severe dilatation of the aortic root, measuring 50 mm. There is mild dilatation of the ascending aorta, measuring 39 mm. Venous: The inferior vena cava is dilated in size with less than 50% respiratory variability, suggesting right atrial pressure of 15 mmHg. IAS/Shunts: No atrial level shunt detected by color flow Doppler.  LEFT VENTRICLE PLAX 2D LVIDd:         5.90 cm   Diastology LVIDs:         4.10 cm  LV e' medial:    5.85 cm/s LV PW:         1.30 cm   LV E/e' medial:  11.1 LV IVS:        1.50 cm   LV e' lateral:   10.90 cm/s LVOT diam:     2.50 cm   LV E/e' lateral: 6.0 LV SV:         121 LV SV Index:   52 LVOT Area:     4.91 cm  RIGHT VENTRICLE          IVC RV Basal diam:  2.70 cm  IVC diam: 2.20 cm LEFT ATRIUM             Index        RIGHT ATRIUM           Index LA diam:        3.70 cm 1.59 cm/m   RA Area:     17.40 cm LA Vol (A2C):   72.1 ml 31.03 ml/m  RA Volume:   41.70 ml  17.95 ml/m LA Vol (A4C):   41.1 ml 17.69 ml/m LA Biplane Vol: 54.7 ml 23.54 ml/m  AORTIC VALVE AV Area (Vmax):    3.83 cm AV Area (Vmean):   3.77 cm AV Area (VTI):     3.91 cm AV Vmax:           160.00 cm/s AV Vmean:          110.000 cm/s AV  VTI:            0.309 m AV Peak Grad:      10.2 mmHg AV Mean Grad:      6.0 mmHg LVOT Vmax:         125.00 cm/s LVOT Vmean:        84.400 cm/s LVOT VTI:          0.246 m LVOT/AV VTI ratio: 0.80  AORTA Ao Root diam: 5.00 cm Ao Asc diam:  3.85 cm MITRAL VALVE               TRICUSPID VALVE MV Area (PHT): 3.77 cm    TR Peak grad:   30.0 mmHg MV Decel Time: 201 msec    TR Vmax:        274.00 cm/s MV E velocity: 64.90 cm/s MV A velocity: 96.50 cm/s  SHUNTS MV E/A ratio:  0.67        Systemic VTI:  0.25 m                            Systemic Diam: 2.50 cm Dorris Carnes MD Electronically signed by Dorris Carnes MD Signature Date/Time: 08/25/2021/6:54:10 PM    Final     PHYSICAL EXAM  Physical Exam  Constitutional: Appears well-developed and well-nourished.  Psych: Affect appropriate to situation Cardiovascular: Normal rate and regular rhythm.  Respiratory: Effort normal, non-labored breathing  Neuro: Mental Status: Patient is awake, alert, oriented to person, place, month, year, and situation. Patient is able to give a clear and coherent history. No signs of aphasia or neglect Cranial Nerves: II: Visual Fields are full. Pupils are equal, round, and reactive to light.   III,IV, VI: EOMI without ptosis or diploplia.  V: Facial sensation is symmetric to temperature VII: Facial movement is symmetric resting and smiling VIII: Hearing is intact to voice X: Palate elevates symmetrically XI: Shoulder shrug is symmetric. XII: Tongue protrudes midline without  atrophy or fasciculations.  Motor: Tone is normal. Bulk is normal. 5/5 strength was present in all four extremities.  Sensory: Sensation is symmetric to light touch and temperature in the arms and legs. No extinction to DSS present.  Deep Tendon Reflexes: 2+ and symmetric in the biceps and patellae.  Coordination: FNF and HKS are intact bilaterally  ASSESSMENT/PLAN Jesus Warren is a 56 y.o. male with history of erectile dysfunction,  hyperlipidemia, low testosterone who presents with 3 day history of headaches and since 0900 on 08/24/21, has had confusion with staggering gait, and slurred speech. Family called EMS and he was brought in as a stroke code. 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 Patient reports that he takes a significant amount of ibuprofen and acetaminophen for headaches. He used to drink alcohol everyday until christmas. Used to drink about 4 shots of vodka. Also sniffs tobacco daily because that gives him a nicotine high. Has been struggling with a lot of mental issues but took himself off the meds and has not followed up with his PCP since 2017. Wife states that she has noticed some memory issues in the the recent past. According to notes patient was confused and agitated in the ED. Patient reports headaches in the back of the head and takes acetaminophen and ibuprofen pretty consistently. He does not routinely take his blood pressure, but states that he has not been diagnosed with HTN. Neurological symptoms seem to have returned to baseline. He his oriented and speech is clear. PT has signed off on him. SLP cognitive exam noted some difficulty with memory and attention, which his wife had noticed at home as well over the last 6 months.  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 Strict blood pressure management with scheduled medications, cleviprex, and PRNs. Awaiting c-spine MRI and carotid doppler results. PT/OT/ST have signed off.   ICH:  right posterior temporal lobe small ICH, atypical location, given numerous peripheral and posterior MCBs on MRI, suspicious for CAA Code Stroke- small R posterior temporal lobe ICH with no intraventricular extension MRI-  Small acute  hemorrhage in the right temporal lobe . Numerous chronic microhemorrhages in the brain. Most severe in the brainstem and cerebellum, but also scattered in the cerebral hemispheres with evidence of a previous right parieto-occipital hemorrhage.  MRA- negative intracranial MRA CT repeat due to headache -no change, stable hematoma Recommend CTA or cerebral angiogram as outpatient after ICH resolution to rule out AVM Carotid Doppler  prelim results- minimal thickening or plaque in carotids 2D Echo EF 50-55% LDL 95 HgbA1c 5.4 VTE prophylaxis - SCDs No antithrombotic prior to admission, now on No antithrombotic due to [place] possible CAA Therapy recommendations:  No follow up  Disposition:  Pending  Posterior headache and neck pain Likely cervical radiculopathy No pain at occipital nerve compression Tramadol as needed Tylenol for pain management, avoid NSAIDS MRI C-spine pending  Hypertension Home meds:  None BP 120-140 Cleviprex IVF Labetolol PRN, Hydralazine PRN Start Norvasc, lisinopril schedule  Hyperlipidemia Home meds:  none, resumed in hospital LDL 95, goal < 70 No statin recommended given possible CAA  Other Stroke Risk Factors Cigarette smoker, advised to stop smoking ETOH use, alcohol level 296, advised to drink no more than 2 drink(s) a day Obesity, Body mass index is 30.04  kg/m., BMI >/= 30 associated with increased stroke risk, recommend weight loss, diet and exercise as appropriate  Headaches Uses tylenol and ibuprofen consistently at home  Other Active Problems Cognitive Impairment SLP evaluation  Family has noticed increased difficulty with memory and attention over the last 6 months.  [place] Mental Status Examination patient scored 29/30  Hospital day # 2  Patient seen and examined by NP/APP with MD. MD to update note as needed.   Janine Ores, DNP, FNP-BC [facility] Pager: [phone]  ATTENDING NOTE: I reviewed above note  and agree with the assessment and plan. Pt was seen and examined.   No family at bedside.  Patient awake alert, lying in bed, stated that his neck pain and posterior headache much improved after tramadol.  Still on low-dose Cleviprex for BP control, will continue amlodipine and lisinopril but also add hydralazine.  MRI C-spine pending.  Transfer out of ICU if able to taper off Cleviprex.  For detailed assessment and plan, please refer to above as I have made changes wherever appropriate.   Rosalin Hawking, MD PhD Stroke Neurology 08/26/2021 7:28 PM  This patient is critically ill due to [place], possibly amyloid angiopathy and at significant risk of neurological worsening, death form recurrent bleeding, cerebral edema, brain herniation. This patient's care requires constant monitoring of vital signs, hemodynamics, respiratory and cardiac monitoring, review of multiple databases, neurological assessment, discussion with family, other specialists and medical decision making of high complexity. I spent 30 minutes of neurocritical care time in the care of this patient.      To contact Stroke Continuity provider, please refer to [email or web address] After hours, contact General Neurology

## 2021-08-26 NOTE — TOC CAGE-AID Note (Signed)
Transition of Care [facility]) - CAGE-AID Screening   Patient Details  Name: Jesus Warren MRN: [id number] Date of Birth: Mar 13, 1966  [facility]) CM/SW Contact:    Dilyn Osoria C Tarpley-Carter, [place] Phone Number: 08/26/2021, 1:05 PM   Clinical Narrative: Pt is unable to participate in Cage Aid. CSW will assess at a better time.  Mirela Parsley Tarpley-Carter, MSW, LCSW-A Pronouns:  She/Her/[place]ers [place] Transitions of Care Clinical Social Worker Direct Number:  [phone] [email or web address]  CAGE-AID Screening: Substance Abuse Screening unable to be completed due to: : Patient unable to participate             Substance Abuse Education Offered: No

## 2021-08-26 NOTE — Progress Notes (Signed)
Pt now transferred to [id number],

## 2021-08-26 NOTE — Progress Notes (Signed)
Carotid artery duplex completed. Refer to "CV Proc" under chart review to view preliminary results.  08/26/2021 10:53 AM Kelby Aline., MHA, RVT, RDCS, RDMS

## 2021-08-27 LAB — BASIC METABOLIC PANEL
Anion gap: 9 (ref 5–15)
BUN: 12 mg/dL (ref 6–20)
CO2: 24 mmol/L (ref 22–32)
Calcium: 8.6 mg/dL — ABNORMAL LOW (ref 8.9–10.3)
Chloride: 106 mmol/L (ref 98–111)
Creatinine, Ser: 1.05 mg/dL (ref 0.61–1.24)
GFR, Estimated: >60 mL/min (ref >60)
Glucose, Bld: 96 mg/dL (ref 70–99)
Potassium: 3.1 mmol/L — ABNORMAL LOW (ref 3.5–5.1)
Sodium: 139 mmol/L (ref 135–145)

## 2021-08-27 LAB — CBC
HCT: 38.6 % — ABNORMAL LOW (ref 39.0–52.0)
Hemoglobin: 13.4 g/dL (ref 13.0–17.0)
MCH: 31.7 pg (ref 26.0–34.0)
MCHC: 34.7 g/dL (ref 30.0–36.0)
MCV: 91.3 fL (ref 80.0–100.0)
Platelets: 99 10*3/uL — ABNORMAL LOW (ref 150–400)
RBC: 4.23 MIL/uL (ref 4.22–5.81)
RDW: 12.7 % (ref 11.5–15.5)
WBC: 7.9 10*3/uL (ref 4.0–10.5)
nRBC: 0 % (ref 0.0–0.2)

## 2021-08-27 MED ORDER — TRAMADOL HCL 50 MG PO TABS: 50.0000 mg | ORAL_TABLET | Freq: Four times a day (QID) | ORAL | 0 refills | Status: DC | PRN

## 2021-08-27 MED ORDER — HYDRALAZINE HCL 50 MG PO TABS: 50.0000 mg | ORAL_TABLET | Freq: Three times a day (TID) | ORAL | 1 refills | Status: AC

## 2021-08-27 MED ORDER — TRAMADOL HCL 50 MG PO TABS: 50.0000 mg | ORAL_TABLET | Freq: Four times a day (QID) | ORAL | 0 refills | Status: AC | PRN

## 2021-08-27 MED ORDER — AMLODIPINE BESYLATE 10 MG PO TABS: 10.0000 mg | ORAL_TABLET | Freq: Every day | ORAL | 1 refills | Status: DC

## 2021-08-27 MED ORDER — LISINOPRIL 20 MG PO TABS
20.0000 mg | ORAL_TABLET | Freq: Two times a day (BID) | ORAL | 1 refills | Status: AC
Start: 2021-08-27 — End: ?

## 2021-08-27 MED ORDER — TIZANIDINE HCL 4 MG PO TABS: 4.0000 mg | ORAL_TABLET | Freq: Three times a day (TID) | ORAL | 0 refills | Status: DC | PRN

## 2021-08-27 NOTE — TOC CAGE-AID Note (Signed)
Transition of Care [facility]) - CAGE-AID Screening   Patient Details  Name: Jesus Warren MRN: [id number] Date of Birth: 1966/06/06  Transition of [facility]) CM/SW Contact:    Gaetano Hawthorne Tarpley-Carter, LCSWA Phone Number: 08/27/2021, 1:04 PM   Clinical Narrative: Pt participated in [place].  Pt stated he does not use substance, drinks ETOH.  Pt was offered resources, due to usage of ETOH.    Patrice Matthew Tarpley-Carter, MSW, LCSW-A Pronouns:  She/Her/Hers [place] Transitions of Care Clinical Social Worker Direct Number:  [phone] [email or web address]  CAGE-AID Screening: Substance Abuse Screening unable to be completed due to: : Patient unable to participate  Have You Ever Felt You Ought to Cut Down on Your Drinking or Drug Use?: Yes Have People Annoyed You By Critizing Your Drinking Or Drug Use?: No Have You Felt Bad Or Guilty About Your Drinking Or Drug Use?: No Have You Ever Had a Drink or Used Drugs First Thing In The Morning to Steady Your Nerves or to Get Rid of a Hangover?: No CAGE-AID Score: 1  Substance Abuse Education Offered: Yes  Substance abuse interventions: Scientist, clinical (histocompatibility and immunogenetics)

## 2021-08-27 NOTE — Progress Notes (Signed)
Occupational Therapy Treatment Patient Details Name: Jesus Warren MRN: [id number] DOB: 09-Oct-1965 Today's Date: 08/27/2021   History of present illness This 56 y.o. male admitted with 3 day h/o HA, confusion, and staggering gait as well as slurred speech.  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 These are most severe in the brainstem and cerebellum, but also scattered in the cerebral hemispheres with evidence of a previous right parieto-occipital hemorrhage. And there may be mild left temporal lobe gyral edema associated with some of the chronic blood products. Advanced for age but comparatively minor other evidence of chronic  small vessel disease. 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 Able to complete path-finding around unit with no observable difficulty. Higher level deficits likely still remain in the areas of attention and moderate problem-solving. Pt does not perceive any cognitive changes "I haven't gotten much sleep the past few days.". Spouse verbalized interest in neuropsych testing as OP for more indepth and detailed analysis of any cognitive changes. Encouraged "brain games" that are moderately challenging. Pt and spouse awaiting MRI results from yesterday.   Recommendations for follow up therapy are one component of a multi-disciplinary discharge planning process, led by the attending physician.  Recommendations may be updated based on patient status, additional functional criteria and insurance authorization.    Follow Up Recommendations  Outpatient OT, neuropsych testing as OP   Assistance Recommended at Discharge Intermittent  Supervision/Assistance  Patient can return home with the following  Direct supervision/assist for medications management (initially)   Equipment Recommendations  None recommended by OT    Recommendations for Other Services      Precautions / Restrictions Precautions Precautions: None       Mobility Bed Mobility Overal bed mobility: Independent                  Transfers Overall transfer level: Independent Equipment used: None                     Balance Overall balance assessment: Independent                                         ADL either performed or assessed with clinical judgement   ADL Overall ADL's : Independent                                            Extremity/Trunk Assessment Upper Extremity Assessment Upper Extremity Assessment: Overall WFL for tasks assessed   Lower Extremity Assessment Lower Extremity Assessment: Overall WFL for tasks assessed        Vision   Visual Fields: No apparent deficits   Perception     Praxis      Cognition Arousal/Alertness: Awake/alert Behavior During Therapy: WFL for tasks assessed/performed Overall Cognitive Status: Impaired/Different from baseline Area of Impairment: Attention;Problem solving                   Current Attention Level: Alternating;Divided  Problem Solving:  (mild difficulty with complex problem solving) General Comments: Pt does not perceive any cognitive changes "I haven't gotten much sleep the past few days.". Spouse verbalized interest in neuropsych testing as OP for more indepth and detailed analysis of any cognitive changes. Pt and spouse awaiting MRI results from yesterday.          Exercises     Shoulder Instructions       General Comments wife present and included in education/discussion around cognition.    Pertinent Vitals/ Pain       Pain Assessment: No/denies pain  Home Living                                           Prior Functioning/Environment              Frequency  Min 2X/week        Progress Toward Goals  OT Goals(current goals can now be found in the care plan section)  Progress towards OT goals: Progressing toward goals  Acute Rehab OT Goals Patient Stated Goal: did not state OT Goal Formulation: With patient/family Time For Goal Achievement: 09/08/21 Potential to Achieve Goals: Good ADL Goals Additional ADL Goal #1: Pt will follow 3 step command during path finding task independently Additional ADL Goal #2: Pt will perform moderately challenging path finding task with no cues Additional ADL Goal #3: Pt will perform simulated money management task independently  Plan Discharge plan remains appropriate    Co-evaluation                 AM-PAC OT "6 Clicks" Daily Activity     Outcome Measure   Help from another person eating meals?: None Help from another person taking care of personal grooming?: None Help from another person toileting, which includes using toliet, bedpan, or urinal?: None Help from another person bathing (including washing, rinsing, drying)?: None Help from another person to put on and taking off regular upper body clothing?: None Help from another person to put on and taking off regular lower body clothing?: None 6 Click Score: 24    End of Session    OT Visit Diagnosis: Cognitive communication deficit (R41.841) Symptoms and signs involving cognitive functions: Other Nontraumatic ICH   Activity Tolerance Patient tolerated treatment well   Patient Left in bed;with call bell/phone within reach;with family/visitor present   Nurse Communication          Time: 9233-0076 OT Time Calculation (min): 13 min  Charges: OT General Charges $OT Visit: 1 Visit OT Treatments $Self Care/Home Management : 8-22 mins  Tyrone Schimke, OT Acute Rehabilitation Services Pager: [phone] Office:  [phone]   Hortencia Pilar 08/27/2021, 10:37 AM

## 2021-08-27 NOTE — Discharge Summary (Addendum)
Stroke Discharge Summary  Patient ID: Jesus Warren   MRN: [id number]      DOB: 05-08-1966  Date of Admission: 08/24/2021 Date of Discharge: 08/27/2021  Attending Physician:  Stroke, Md, MD, Rosalin Hawking MD Consultant(s):    None  Patient's PCP:  Derinda Late, MD  DISCHARGE DIAGNOSIS:  Principal Problem:   ICH (intracerebral hemorrhage) ([place])   Secondary diagnosis   ? CAA   HTN   HLD   Smoker   Alcohol abuse   Allergies as of 08/27/2021   No Known Allergies      Medication List     STOP taking these medications    ibuprofen 200 MG tablet Commonly known as: ADVIL       TAKE these medications    acetaminophen 500 MG tablet Commonly known as: TYLENOL Take 1,000 mg by mouth every 6 (six) hours as needed for moderate pain or headache.   amLODipine 10 MG tablet Commonly known as: NORVASC Take 1 tablet (10 mg total) by mouth daily. Start taking on: August 28, 2021   hydrALAZINE 50 MG tablet Commonly known as: APRESOLINE Take 1 tablet (50 mg total) by mouth every 8 (eight) hours.   lisinopril 20 MG tablet Commonly known as: ZESTRIL Take 1 tablet (20 mg total) by mouth 2 (two) times daily.   tiZANidine 4 MG tablet Commonly known as: ZANAFLEX Take 1 tablet (4 mg total) by mouth every 8 (eight) hours as needed for muscle spasms.   traMADol 50 MG tablet Commonly known as: ULTRAM Take 1 tablet (50 mg total) by mouth every 6 (six) hours as needed for up to 5 days for moderate pain.        LABORATORY STUDIES CBC    Component Value Date/Time   WBC 7.9 08/27/2021 0136   RBC 4.23 08/27/2021 0136   HGB 13.4 08/27/2021 0136   HCT 38.6 (L) 08/27/2021 0136   PLT 99 (L) 08/27/2021 0136   MCV 91.3 08/27/2021 0136   MCH 31.7 08/27/2021 0136   MCHC 34.7 08/27/2021 0136   RDW 12.7 08/27/2021 0136   LYMPHSABS 2.8 08/24/2021 2007   MONOABS 0.7 08/24/2021 2007   EOSABS 0.2 08/24/2021 2007   BASOSABS 0.1 08/24/2021 2007   CMP    Component Value Date/Time    NA 139 08/27/2021 0136   K 3.1 (L) 08/27/2021 0136   CL 106 08/27/2021 0136   CO2 24 08/27/2021 0136   GLUCOSE 96 08/27/2021 0136   BUN 12 08/27/2021 0136   CREATININE 1.05 08/27/2021 0136   CALCIUM 8.6 (L) 08/27/2021 0136   PROT 7.0 08/24/2021 2007   ALBUMIN 4.0 08/24/2021 2007   AST 62 (H) 08/24/2021 2007   ALT 80 (H) 08/24/2021 2007   ALKPHOS 116 08/24/2021 2007   BILITOT 0.8 08/24/2021 2007   GFRNONAA >60 08/27/2021 0136   COAGS Lab Results  Component Value Date   INR 1.1 08/24/2021   Lipid Panel    Component Value Date/Time   CHOL 209 (H) 08/25/2021 0942   TRIG 298 (H) 08/25/2021 0942   HDL 54 08/25/2021 0942   CHOLHDL 3.9 08/25/2021 0942   VLDL 60 (H) 08/25/2021 0942   LDLCALC 95 08/25/2021 0942   HgbA1C  Lab Results  Component Value Date   HGBA1C 5.4 08/25/2021   Urinalysis No results found for: COLORURINE, APPEARANCEUR, LABSPEC, PHURINE, GLUCOSEU, HGBUR, BILIRUBINUR, KETONESUR, PROTEINUR, UROBILINOGEN, NITRITE, LEUKOCYTESUR Urine Drug Screen     Component Value Date/Time   LABOPIA NONE  DETECTED 08/25/2021 0106   COCAINSCRNUR NONE DETECTED 08/25/2021 0106   LABBENZ NONE DETECTED 08/25/2021 0106   AMPHETMU NONE DETECTED 08/25/2021 0106   THCU NONE DETECTED 08/25/2021 0106   LABBARB NONE DETECTED 08/25/2021 0106    Alcohol Level    Component Value Date/Time   ETH 296 (H) 08/24/2021 2024     SIGNIFICANT DIAGNOSTIC STUDIES CT HEAD WO CONTRAST (5MM)  Result Date: 08/25/2021 CLINICAL DATA:  Hemorrhagic stroke. EXAM: CT HEAD WITHOUT CONTRAST TECHNIQUE: Contiguous axial images were obtained from the base of the skull through the vertex without intravenous contrast. COMPARISON:  Head CT earlier today FINDINGS: Brain: Right temporal lobe periventricular hemorrhage measures 2.4 x 0.9 x 1.8 cm (volume = 2 cm^3), minimally increased in volume from prior exam, although subjectively not significantly increased in size. Minimal surrounding edema. No new areas of  hemorrhage. Stable brain volume, no hydrocephalus. Again seen periventricular chronic small vessel ischemia. Vascular: Atherosclerosis of skullbase vasculature without hyperdense vessel or abnormal calcification. Skull: No fracture or focal lesion. Sinuses/Orbits: Mucosal thickening throughout the paranasal sinuses. Mastoid air cells are clear. No acute orbital findings. Other: None. IMPRESSION: 1. Right temporal lobe hemorrhage is subjectively not significantly increased in size. Minimal surrounding edema. No new areas of hemorrhage. 2. No new hemorrhage. Electronically Signed   By: Keith Rake M.D.   On: 08/25/2021 17:02   CT HEAD WO CONTRAST  Result Date: 08/25/2021 CLINICAL DATA:  Follow-up bleed EXAM: CT HEAD WITHOUT CONTRAST TECHNIQUE: Contiguous axial images were obtained from the base of the skull through the vertex without intravenous contrast. COMPARISON:  08/24/2021 FINDINGS: Brain: Again noted is the posterior right temporal lobe hemorrhage which measures 2.6 x 1.0 x 1.3 cm. Volume 1.7 mL. Visually, no significant change since prior study. No new areas of hemorrhage. No mass effect or midline shift. No hydrocephalus. Vascular: No hyperdense vessel or unexpected calcification. Skull: No acute calvarial abnormality. Sinuses/Orbits: Mucosal thickening in the maxillary sinuses. Other: None IMPRESSION: Posterior right temporal intraparenchymal hemorrhage, not significantly changed since prior study. Electronically Signed   By: Rolm Baptise M.D.   On: 08/25/2021 01:49   MR ANGIO HEAD WO CONTRAST  Result Date: 08/25/2021 CLINICAL DATA:  56 year old male with neurologic deficit. Right temporal lobe hemorrhage. EXAM: MRI HEAD WITHOUT CONTRAST AND WITH MRA HEAD WITHOUT CONTRAST TECHNIQUE: Multiplanar, multi-echo pulse sequences of the brain and surrounding structures were acquired without and with intravenous contrast. Angiographic images of the Circle of Willis were acquired using MRA technique without  intravenous contrast. COMPARISON:  Head CTs 08/25/2021 and 08/24/2021. FINDINGS: MRI HEAD FINDINGS Brain: T1 isointense mix signal intensity T2 intra-axial hemorrhage in the right temporal lobe fusiform gyrus encompasses about 30 x 11 x 14 mm (AP by transverse by CC) for an estimated blood volume of 2-3 mL. Mild regional edema. No significant regional mass effect. No abnormal enhancement identified. No intraventricular or extra-axial extension. No intraventricular blood or debris identified. Susceptibility artifact on DWI. No larger area of restricted diffusion. Numerous chronic microhemorrhages in the brain, most severe throughout the brainstem and cerebellum. But multiple hemispheric foci of chronic hemosiderin also, including involving a 13 mm area at the junction of the right posterior parietal and occipital lobes on series 11, image 40. And contralateral posterior left temporal lobe patchy edema or encephalomalacia also appears associated with chronic hemosiderin on the SWI (although susceptibility artifact affects detail). No superimposed midline shift, mass effect, evidence of mass lesion, ventriculomegaly, extra-axial collection. Cervicomedullary junction and pituitary are within normal limits.  No abnormal enhancement identified. No dural thickening. Chronic lacunar infarct of the posterior right external capsule and moderate for age other scattered bilateral white matter T2 and FLAIR hyperintensity. Moderate T2 and FLAIR heterogeneity throughout the brainstem. Vascular: Major intracranial vascular flow voids are preserved. Distal right vertebral artery appears dominant. The major dural venous sinuses are enhancing and appear to be patent. Skull and upper cervical spine: Negative visible cervical spine. Visualized bone marrow signal is within normal limits. Sinuses/Orbits: Negative orbits. Mild to moderate paranasal sinus mucosal thickening. Other: Mastoids are clear. Visible internal auditory structures  appear normal. MRA HEAD FINDINGS Anterior circulation: Antegrade flow in both ICA siphons. Mild bilateral ICA tortuosity. No ICA stenosis. Normal left posterior communicating artery and ophthalmic artery origins. Patent carotid termini. Normal MCA and ACA origins. Mildly dominant right ACA A1. Normal anterior communicating artery. Visible bilateral MCA and ACA branches are within normal limits. Posterior circulation: Antegrade flow in the posterior circulation with dominant distal right vertebral artery. Patent vertebrobasilar junction. Mild basilar artery irregularity. No distal vertebral or basilar stenosis. SCA and right PCA origins are normal. Fetal type left PCA origin. Diminutive or absent right posterior communicating artery. Bilateral PCA branches are within normal limits. No abnormal flow signal about the right temporal lobe hemorrhage. Anatomic variants: Fetal type left PCA origin. Dominant right vertebral artery and right ACA A1. IMPRESSION: 1. Small acute intra-axial hemorrhage in the right temporal lobe fusiform gyrus, estimated blood volume 2-3 mL. Mild regional edema with no mass effect. No abnormal enhancement, extra-axial extension, or other complicating features. 2. Numerous chronic microhemorrhages in the brain. These are most severe in the brainstem and cerebellum, but also scattered in the cerebral hemispheres with evidence of a previous right parieto-occipital hemorrhage. And there may be mild left temporal lobe gyral edema associated with some of the chronic blood products. Advanced for age but comparatively minor other evidence of chronic small vessel disease. 3. This constellation is suspicious for Amyloid Angiopathy, despite the relatively young age group. Is there history of renal failure or other risk factor for amyloidosis? Otherwise assume advanced chronic hypertension or other advanced small vessel disease. 4. Negative intracranial MRA. Electronically Signed   By: Genevie Ann M.D.   On:  08/25/2021 09:42   MR BRAIN W WO CONTRAST  Result Date: 08/25/2021 CLINICAL DATA:  56 year old male with neurologic deficit. Right temporal lobe hemorrhage. EXAM: MRI HEAD WITHOUT CONTRAST AND WITH MRA HEAD WITHOUT CONTRAST TECHNIQUE: Multiplanar, multi-echo pulse sequences of the brain and surrounding structures were acquired without and with intravenous contrast. Angiographic images of the Circle of Willis were acquired using MRA technique without intravenous contrast. COMPARISON:  Head CTs 08/25/2021 and 08/24/2021. FINDINGS: MRI HEAD FINDINGS Brain: T1 isointense mix signal intensity T2 intra-axial hemorrhage in the right temporal lobe fusiform gyrus encompasses about 30 x 11 x 14 mm (AP by transverse by CC) for an estimated blood volume of 2-3 mL. Mild regional edema. No significant regional mass effect. No abnormal enhancement identified. No intraventricular or extra-axial extension. No intraventricular blood or debris identified. Susceptibility artifact on DWI. No larger area of restricted diffusion. Numerous chronic microhemorrhages in the brain, most severe throughout the brainstem and cerebellum. But multiple hemispheric foci of chronic hemosiderin also, including involving a 13 mm area at the junction of the right posterior parietal and occipital lobes on series 11, image 40. And contralateral posterior left temporal lobe patchy edema or encephalomalacia also appears associated with chronic hemosiderin on the SWI (although susceptibility artifact affects detail).  No superimposed midline shift, mass effect, evidence of mass lesion, ventriculomegaly, extra-axial collection. Cervicomedullary junction and pituitary are within normal limits. No abnormal enhancement identified. No dural thickening. Chronic lacunar infarct of the posterior right external capsule and moderate for age other scattered bilateral white matter T2 and FLAIR hyperintensity. Moderate T2 and FLAIR heterogeneity throughout the  brainstem. Vascular: Major intracranial vascular flow voids are preserved. Distal right vertebral artery appears dominant. The major dural venous sinuses are enhancing and appear to be patent. Skull and upper cervical spine: Negative visible cervical spine. Visualized bone marrow signal is within normal limits. Sinuses/Orbits: Negative orbits. Mild to moderate paranasal sinus mucosal thickening. Other: Mastoids are clear. Visible internal auditory structures appear normal. MRA HEAD FINDINGS Anterior circulation: Antegrade flow in both ICA siphons. Mild bilateral ICA tortuosity. No ICA stenosis. Normal left posterior communicating artery and ophthalmic artery origins. Patent carotid termini. Normal MCA and ACA origins. Mildly dominant right ACA A1. Normal anterior communicating artery. Visible bilateral MCA and ACA branches are within normal limits. Posterior circulation: Antegrade flow in the posterior circulation with dominant distal right vertebral artery. Patent vertebrobasilar junction. Mild basilar artery irregularity. No distal vertebral or basilar stenosis. SCA and right PCA origins are normal. Fetal type left PCA origin. Diminutive or absent right posterior communicating artery. Bilateral PCA branches are within normal limits. No abnormal flow signal about the right temporal lobe hemorrhage. Anatomic variants: Fetal type left PCA origin. Dominant right vertebral artery and right ACA A1. IMPRESSION: 1. Small acute intra-axial hemorrhage in the right temporal lobe fusiform gyrus, estimated blood volume 2-3 mL. Mild regional edema with no mass effect. No abnormal enhancement, extra-axial extension, or other complicating features. 2. Numerous chronic microhemorrhages in the brain. These are most severe in the brainstem and cerebellum, but also scattered in the cerebral hemispheres with evidence of a previous right parieto-occipital hemorrhage. And there may be mild left temporal lobe gyral edema associated with  some of the chronic blood products. Advanced for age but comparatively minor other evidence of chronic small vessel disease. 3. This constellation is suspicious for Amyloid Angiopathy, despite the relatively young age group. Is there history of renal failure or other risk factor for amyloidosis? Otherwise assume advanced chronic hypertension or other advanced small vessel disease. 4. Negative intracranial MRA. Electronically Signed   By: Genevie Ann M.D.   On: 08/25/2021 09:42   MR CERVICAL SPINE WO CONTRAST  Result Date: 08/26/2021 CLINICAL DATA:  Neck pain, chronic EXAM: MRI CERVICAL SPINE WITHOUT CONTRAST TECHNIQUE: Multiplanar, multisequence MR imaging of the cervical spine was performed. No intravenous contrast was administered. COMPARISON:  None. FINDINGS: Alignment: No significant listhesis. Vertebrae: Vertebral body heights are maintained. There is no marrow edema. No suspicious osseous lesion. Cord: Normal caliber and signal. Posterior Fossa, vertebral arteries, paraspinal tissues: Unremarkable. Disc levels: C2-C3:  No stenosis. C3-C4:  No stenosis. C4-C5:  No stenosis. C5-C6: Disc bulge with endplate osteophytes. Mild canal stenosis. No right foraminal stenosis. Minimal left foraminal stenosis. C6-C7:  No stenosis. C7-T1: Left facet arthropathy.  No stenosis. IMPRESSION: Degenerative changes primarily at C5-C6. No high-grade stenosis. There is also left facet arthropathy at C7-T1. Electronically Signed   By: Macy Mis M.D.   On: 08/26/2021 17:54   ECHOCARDIOGRAM COMPLETE  Result Date: 08/25/2021    ECHOCARDIOGRAM REPORT   Patient Name:   JEZIEL HOFFMANN Date of Exam: 08/25/2021 Medical Rec #:  [id number]      Height:       74.0 in Accession #:  [id number]     Weight:       234.0 lb Date of Birth:  1966/04/07      BSA:          2.323 m Patient Age:    89 years       BP:           164/110 mmHg Patient Gender: M              HR:           93 bpm. Exam Location:  Inpatient Procedure: 2D Echo, Cardiac  Doppler and Color Doppler Indications:    Stroke  History:        Patient has no prior history of Echocardiogram examinations.                 Risk Factors:Hypertension.  Sonographer:    Merrie Roof RDCS Referring Phys: [id number] [place]  1. Left ventricular ejection fraction, by estimation, is 50 to 55%. The left ventricle has low normal function. The left ventricular internal cavity size was severely dilated. There is mild left ventricular hypertrophy. Left ventricular diastolic parameters were normal.  2. Right ventricular systolic function is normal. The right ventricular size is normal. There is normal pulmonary artery systolic pressure.  3. The mitral valve is normal in structure. Mild mitral valve regurgitation.  4. The aortic valve is tricuspid. Aortic valve regurgitation is mild to moderate.  5. Sinotubular junction is 36 mm. Aortic dilatation noted. There is severe dilatation of the aortic root, measuring 50 mm. There is mild dilatation of the ascending aorta, measuring 39 mm.  6. The inferior vena cava is dilated in size with <50% respiratory variability, suggesting right atrial pressure of 15 mmHg. FINDINGS  Left Ventricle: Left ventricular ejection fraction, by estimation, is 50 to 55%. The left ventricle has low normal function. The left ventricular internal cavity size was severely dilated. There is mild left ventricular hypertrophy. Left ventricular diastolic parameters were normal. Right Ventricle: The right ventricular size is normal. Right vetricular wall thickness was not assessed. Right ventricular systolic function is normal. There is normal pulmonary artery systolic pressure. The tricuspid regurgitant velocity is 2.74 m/s, and with an assumed right atrial pressure of 3 mmHg, the estimated right ventricular systolic pressure is 19.6 mmHg. Left Atrium: Left atrial size was normal in size. Right Atrium: Right atrial size was normal in size. Pericardium: There is no evidence of  pericardial effusion. Mitral Valve: The mitral valve is normal in structure. Mild mitral valve regurgitation. Tricuspid Valve: The tricuspid valve is normal in structure. Tricuspid valve regurgitation is trivial. Aortic Valve: The aortic valve is tricuspid. Aortic valve regurgitation is mild to moderate. Aortic valve mean gradient measures 6.0 mmHg. Aortic valve peak gradient measures 10.2 mmHg. Aortic valve area, by VTI measures 3.91 cm. Pulmonic Valve: The pulmonic valve was normal in structure. Pulmonic valve regurgitation is mild. Aorta: Sinotubular junction is 36 mm. Aortic dilatation noted. There is severe dilatation of the aortic root, measuring 50 mm. There is mild dilatation of the ascending aorta, measuring 39 mm. Venous: The inferior vena cava is dilated in size with less than 50% respiratory variability, suggesting right atrial pressure of 15 mmHg. IAS/Shunts: No atrial level shunt detected by color flow Doppler.  LEFT VENTRICLE PLAX 2D LVIDd:         5.90 cm   Diastology LVIDs:         4.10 cm   LV e' medial:  5.85 cm/s LV PW:         1.30 cm   LV E/e' medial:  11.1 LV IVS:        1.50 cm   LV e' lateral:   10.90 cm/s LVOT diam:     2.50 cm   LV E/e' lateral: 6.0 LV SV:         121 LV SV Index:   52 LVOT Area:     4.91 cm  RIGHT VENTRICLE          IVC RV Basal diam:  2.70 cm  IVC diam: 2.20 cm LEFT ATRIUM             Index        RIGHT ATRIUM           Index LA diam:        3.70 cm 1.59 cm/m   RA Area:     17.40 cm LA Vol (A2C):   72.1 ml 31.03 ml/m  RA Volume:   41.70 ml  17.95 ml/m LA Vol (A4C):   41.1 ml 17.69 ml/m LA Biplane Vol: 54.7 ml 23.54 ml/m  AORTIC VALVE AV Area (Vmax):    3.83 cm AV Area (Vmean):   3.77 cm AV Area (VTI):     3.91 cm AV Vmax:           160.00 cm/s AV Vmean:          110.000 cm/s AV VTI:            0.309 m AV Peak Grad:      10.2 mmHg AV Mean Grad:      6.0 mmHg LVOT Vmax:         125.00 cm/s LVOT Vmean:        84.400 cm/s LVOT VTI:          0.246 m LVOT/AV VTI  ratio: 0.80  AORTA Ao Root diam: 5.00 cm Ao Asc diam:  3.85 cm MITRAL VALVE               TRICUSPID VALVE MV Area (PHT): 3.77 cm    TR Peak grad:   30.0 mmHg MV Decel Time: 201 msec    TR Vmax:        274.00 cm/s MV E velocity: 64.90 cm/s MV A velocity: 96.50 cm/s  SHUNTS MV E/A ratio:  0.67        Systemic VTI:  0.25 m                            Systemic Diam: 2.50 cm Dorris Carnes MD Electronically signed by Dorris Carnes MD Signature Date/Time: 08/25/2021/6:54:10 PM    Final    CT HEAD CODE STROKE WO CONTRAST  Result Date: 08/24/2021 CLINICAL DATA:  Code stroke. EXAM: CT HEAD WITHOUT CONTRAST TECHNIQUE: Contiguous axial images were obtained from the base of the skull through the vertex without intravenous contrast. COMPARISON:  None. FINDINGS: Brain: Acute intraparenchymal hemorrhage within the posterior right temporal lobe measuring 2.6 x 0.7 x 1.0 cm (volume approximately 0.9 mL). No intraventricular extension. No mass effect. The size and configuration of the ventricles and extra-axial CSF spaces are normal. There is hypoattenuation of the periventricular white matter, most commonly indicating chronic ischemic microangiopathy. Vascular: No abnormal hyperdensity of the major intracranial arteries or dural venous sinuses. No intracranial atherosclerosis. Skull: The visualized skull base, calvarium and extracranial soft tissues are normal. Sinuses/Orbits: No fluid levels  or advanced mucosal thickening of the visualized paranasal sinuses. No mastoid or middle ear effusion. The orbits are normal. IMPRESSION: Acute intraparenchymal hemorrhage within the posterior right temporal lobe measuring 2.6 x 0.7 x 1.0 cm (volume approximately 0.9 mL). Critical Value/emergent results were called by telephone at the time of interpretation on 08/24/2021 at 8:25 pm to provider [facility], who verbally acknowledged these results. Electronically Signed   By: Ulyses Jarred M.D.   On: 08/24/2021 20:26   VAS US CAROTID  Result  Date: 08/26/2021 Carotid Arterial Duplex Study Patient Name:  ESHAWN COOR  Date of Exam:   08/26/2021 Medical Rec #: [id number]       Accession #:    [id number] Date of Birth: [birth year]       Patient Gender: M Patient Age:   4 years Exam Location:  [facility] Procedure:      VAS US CAROTID Referring Phys: [facility] --------------------------------------------------------------------------------  Indications:       CVA. Risk Factors:      Hyperlipidemia. Comparison Study:  No prior study Performing Technologist: Maudry Mayhew MHA, RDMS, RVT, RDCS  Examination Guidelines: A complete evaluation includes B-mode imaging, spectral Doppler, color Doppler, and power Doppler as needed of all accessible portions of each vessel. Bilateral testing is considered an integral part of a complete examination. Limited examinations for reoccurring indications may be performed as noted.  Right Carotid Findings: +----------+--------+--------+--------+------------------+------------------+             PSV cm/s EDV cm/s Stenosis Plaque Description Comments            +----------+--------+--------+--------+------------------+------------------+  CCA Prox   86       13                                                       +----------+--------+--------+--------+------------------+------------------+  CCA Distal 71       15                                   intimal thickening  +----------+--------+--------+--------+------------------+------------------+  ICA Prox   40       6                                                        +----------+--------+--------+--------+------------------+------------------+  ICA Distal 58       16                                   intimal thickening  +----------+--------+--------+--------+------------------+------------------+  ECA        97       12                                                       +----------+--------+--------+--------+------------------+------------------+  +----------+--------+-------+----------------+-------------------+             PSV cm/s EDV  cms Describe         Arm Pressure (mmHG)  +----------+--------+-------+----------------+-------------------+  Subclavian 91               Multiphasic, WNL                      +----------+--------+-------+----------------+-------------------+ +---------+--------+--+--------+--+---------+  Vertebral PSV cm/s 41 EDV cm/s 15 Antegrade  +---------+--------+--+--------+--+---------+  Left Carotid Findings: +----------+--------+--------+--------+------------------------------+--------+             PSV cm/s EDV cm/s Stenosis Plaque Description             Comments  +----------+--------+--------+--------+------------------------------+--------+  CCA Prox   89       14                                                         +----------+--------+--------+--------+------------------------------+--------+  CCA Distal 70       15                                                         +----------+--------+--------+--------+------------------------------+--------+  ICA Prox   46       9                 smooth and heterogenous                  +----------+--------+--------+--------+------------------------------+--------+  ICA Distal 51       19                                                         +----------+--------+--------+--------+------------------------------+--------+  ECA        107      13                focal, smooth and heterogenous           +----------+--------+--------+--------+------------------------------+--------+ +----------+--------+--------+----------------+-------------------+             PSV cm/s EDV cm/s Describe         Arm Pressure (mmHG)  +----------+--------+--------+----------------+-------------------+  Subclavian 99                Multiphasic, WNL                      +----------+--------+--------+----------------+-------------------+ +---------+--------+--+--------+-+---------+  Vertebral PSV cm/s 38 EDV  cm/s 7 Antegrade  +---------+--------+--+--------+-+---------+   Summary: Right Carotid: The extracranial vessels were near-normal with only minimal wall                thickening or plaque. Left Carotid: The extracranial vessels were near-normal with only minimal wall               thickening or plaque. Vertebrals:  Bilateral vertebral arteries demonstrate antegrade flow. Subclavians: Normal flow hemodynamics were seen in bilateral subclavian              arteries. *See table(s) above for measurements and observations.     Preliminary  HISTORY OF PRESENT ILLNESS Mr. BARNABAS HENRIQUES is a 56 y.o. male with history of erectile dysfunction, hyperlipidemia, low testosterone who presents with 3 day history of headaches and since 0900 on 08/24/21, has had confusion with staggering gait, and slurred speech. Family called EMS and he was brought in as a stroke code. 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 Patient reports that he takes a significant amount of ibuprofen and acetaminophen for headaches. He used to drink alcohol everyday until christmas. Used to drink about 4 shots of vodka. Also sniffs tobacco daily because that gives him a nicotine high. Has been struggling with a lot of mental issues but took himself off the meds and has not followed up with his PCP since 2017. Wife states that she has noticed some memory issues in the the recent past. According to notes patient was confused and agitated in the ED. Patient reports headaches in the back of the head and takes acetaminophen and ibuprofen pretty consistently. He does not routinely take his blood pressure, but states that he has not been diagnosed with HTN. Neurological symptoms seem to have returned to baseline. He his oriented and speech is clear. PT has signed off on him. SLP cognitive exam noted some difficulty with memory and attention, which his wife had noticed at home as well over the last 6  months.  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 Strict blood pressure management with scheduled medications, cleviprex, and PRNs. Awaiting c-spine MRI and carotid doppler results. PT/OT/ST have signed off.     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 Numerous chronic microhemorrhages in the brain. Most severe in the brainstem and cerebellum, but also scattered in the cerebral hemispheres with evidence of a previous right parieto-occipital hemorrhage.  MRA- negative intracranial MRA CT repeat due to headache -no change, stable hematoma Recommend CTA or cerebral angiogram as outpatient after ICH resolution to rule out AVM Carotid Doppler  prelim results- minimal thickening or plaque in carotids 2D Echo EF 50-55% LDL 95 HgbA1c 5.4 VTE prophylaxis - SCDs No antithrombotic prior to admission, now on No antithrombotic due to [place] possible CAA Therapy recommendations:  No follow up  Disposition:  Pending   Posterior headache and neck pain Likely cervical radiculopathy No pain at occipital nerve compression Tramadol as needed Tylenol for pain management, avoid NSAIDS MRI C-spine pending   Hypertension Home meds:  None BP 120-140 Cleviprex IVF Labetolol PRN, Hydralazine PRN Start Norvasc, lisinopril schedule   Hyperlipidemia Home meds:  none, resumed in hospital LDL 95, goal < 70 No statin recommended given possible CAA   Other Stroke Risk Factors Cigarette smoker, advised to stop smoking ETOH use, alcohol level 296, advised to drink  no more than 2 drink(s) a day Obesity, Body mass index is 30.04 kg/m., BMI >/= 30 associated with increased stroke risk, recommend weight loss, diet and exercise as appropriate  Headaches Uses tylenol and ibuprofen consistently at home   Other Active Problems Cognitive Impairment SLP evaluation  Family has noticed increased difficulty with memory and attention over the last 6  months.  [place] Mental Status Examination patient scored 25/30   DISCHARGE EXAM Blood pressure (!) 136/94, pulse 72, temperature 98.7 F (37.1 C), temperature source Oral, resp. rate 18, height 6\' 2"  (1.88 m), weight 106.1 kg, SpO2 98 %. Physical Exam  Constitutional: Appears well-developed and well-nourished.  Psych: Affect appropriate to situation Cardiovascular: Normal rate and regular rhythm.  Respiratory: Effort normal, non-labored breathing   Neuro: Mental Status: Patient is awake, alert, oriented to person, place, month, year, and situation. Patient is able to give a clear and coherent history. No signs of aphasia or neglect Cranial Nerves: II: Visual Fields are full. Pupils are equal, round, and reactive to light.   III,IV, VI: EOMI without ptosis or diploplia.  V: Facial sensation is symmetric to temperature VII: Facial movement is symmetric resting and smiling VIII: Hearing is intact to voice X: Palate elevates symmetrically XI: Shoulder shrug is symmetric. XII: Tongue protrudes midline without atrophy or fasciculations.  Motor: Tone is normal. Bulk is normal. 5/5 strength was present in all four extremities.  Sensory: Sensation is symmetric to light touch and temperature in the arms and legs. No extinction to DSS present.  Deep Tendon Reflexes: 2+ and symmetric in the biceps and patellae.  Coordination: FNF and HKS are intact bilaterally   DISCHARGE PLAN Disposition:  Home with Outpatient Neuro OT Ongoing stroke risk factor control by Primary Care Physician at time of  discharge Follow-up PCP Derinda Late, MD in 2 weeks. Follow-up in [facility] in 4 weeks, office to schedule an appointment.   35 minutes were spent preparing discharge.  Patient seen and examined by NP/APP with MD. MD to update note as needed.   Janine Ores, DNP, FNP-BC [facility] Pager: [phone]  ATTENDING NOTE: I reviewed above note and agree with the assessment and plan. Pt was seen and examined.   Wife at the bedside. Pt lying in bed, no complains, no significant HA, neuro intact. MRI C-spine no severe spinal stenosis or spinal cord compression. BP stable on BP meds. Will d/c home today. Strict BP control and monitoring at home. Avoid antithrombotics. Follow up in clinic in 4 weeks.   For detailed assessment and plan, please refer to above as I have made changes wherever appropriate.   Rosalin Hawking, MD PhD Stroke Neurology 08/27/2021 10:39 PM

## 2021-08-27 NOTE — TOC Transition Note (Signed)
Transition of Care [facility]) - CM/SW Discharge Note   Patient Details  Name: Jesus Warren MRN: [id number] Date of Birth: 08-13-1966  [facility] CM/SW Contact:  Pollie Friar, RN Phone Number: 08/27/2021, 1:04 PM   Clinical Narrative:    Patient is discharging home with outpatient OT through [facility] main rehab. Orders in Epic and information on the AVS. No DME needs.  Pt's spouse is able to over see medications at home.  Pt has transport home.    Final next level of care: OP Rehab Barriers to Discharge: No Barriers Identified   Patient Goals and CMS Choice     Choice offered to / list presented to : Patient, Spouse  Discharge Placement                       Discharge Plan and Services                                     Social Determinants of Health (SDOH) Interventions     Readmission Risk Interventions No flowsheet data found.

## 2021-09-03 ENCOUNTER — Other Ambulatory Visit: Payer: Self-pay

## 2021-09-03 ENCOUNTER — Ambulatory Visit: Payer: 59 | Attending: Neurology

## 2021-09-03 DIAGNOSIS — H539 Unspecified visual disturbance: Secondary | ICD-10-CM | POA: Diagnosis present

## 2021-09-03 DIAGNOSIS — I69111 Memory deficit following nontraumatic intracerebral hemorrhage: Secondary | ICD-10-CM | POA: Insufficient documentation

## 2021-09-04 NOTE — Therapy (Signed)
[place] MAIN [facility] [address] [place], [place], [address] Phone: [phone]   Fax:  [phone]  Occupational Therapy Evaluation  Patient Details  Name: ACEN CRAUN MRN: [id number] Date of Birth: 08-Feb-1966 No data recorded  Encounter Date: 09/03/2021   OT End of Session - 09/04/21 1256     Visit Number 1    Number of Visits 1    OT Start Time 6962    OT Stop Time 1618    OT Time Calculation (min) 70 min    Activity Tolerance Patient tolerated treatment well    Behavior During Therapy [facility] for tasks assessed/performed             Past Medical History:  Diagnosis Date   Allergic rhinitis    Anxiety    Erectile dysfunction    Hyperlipidemia    Low testosterone    Umbilical hernia     Past Surgical History:  Procedure Laterality Date   COLONOSCOPY WITH PROPOFOL N/A 08/05/2016   Procedure: COLONOSCOPY WITH PROPOFOL;  Surgeon: Lollie Sails, MD;  Location: [facility] ENDOSCOPY;  Service: Endoscopy;  Laterality: N/A;   VASECTOMY      There were no vitals filed for this visit.   Subjective Assessment - 09/04/21 1036     Subjective  "My main concern is my vision."    Pertinent History Recent R posterior temporal lobe ICH, hx of HTN, HLD, smoker, alcohol abuse    Limitations intermittent severe HA, continued HTN, intermittent visual disturbances, mild memory and attention changes since CVA    Patient Stated Goals "Get back to normal."    Currently in Pain? No/denies    Pain Score 0-No pain               [facility] OT Assessment - 09/04/21 0001       Assessment   Onset Date/Surgical Date 08/24/21    Hand Dominance Right    Next MD Visit Dr. Governor Specking PCP 09/05/21, neurologist on 10/18/21    Prior Therapy Acute OT only      Precautions   Precautions None      Restrictions   Weight Bearing Restrictions No      Balance Screen   Has the patient fallen in the past 6 months Yes    How many times? 2     Has the patient had a decrease in activity level because of a fear of falling?  No    Is the patient reluctant to leave their home because of a fear of falling?  No   1 fall caused by tripping, the other during CVA; no injuries     Home  Environment   Family/patient expects to be discharged to: Private residence    Living Arrangements Spouse/significant other    Additional Comments pt is indep with all basic self care without AE      Prior Function   Level of Independence Independent    Vocation Unemployed   Pt is in between jobs, but was working full time in [employer] in [employer].   Vocation Requirements excessive amount of driving    Leisure hunting, fishing, golfing, spending time with dog      ADL   ADL comments pt is indep with all basic self care      IADL   Medication Management Is not capable of dispensing or managing own medication   spouse has been managing meds since CVA d/t new meds and new  memory deficits.  Using pill organizer.     Mobility   Mobility Status Independent    Mobility Status Comments without need for AD      Vision - History   Baseline Vision Wears glasses for distance only    Patient Visual Report Eye fatigue/eye pain/headache;Central vision impairment   Pt reports only episodic disturbances when he has a severe headache.     Vision Assessment   Ocular Range of Motion Within Functional Limits    Tracking/Visual Pursuits Able to track stimulus in all quads without difficulty    Saccades Within functional limits    Visual Fields No apparent deficits    Comment Line cancellation test: WNL, clock drawing test WNL, Letter cancellation WNL.      Cognition   Overall Cognitive Status Impaired/Different from baseline   Short Blessed Test given this day with pt scoring a 4 (normal range for normal cognitive functioning); pt shows difficulty with stating months of the year in reverse, and recalling a short sentence (took 3 trials for accurate  response)   Cognition Comments spouse verbalizes concern for pt staying by himself and forgetting to take his medication for HTN      Observation/Other Assessments   Focus on Therapeutic Outcomes (FOTO)  86      Sensation   Light Touch Appears Intact      Coordination   Gross Motor Movements are Fluid and Coordinated Yes    Fine Motor Movements are Fluid and Coordinated Yes    Finger Nose Finger Test intact bilaterally    Right 9 Hole Peg Test 21 sec    Left 9 Hole Peg Test 21 sec      AROM   Overall AROM Comments BUEs WNL      Strength   Overall Strength Comments BUEs 5/5      Hand Function   Right Hand Grip (lbs) 120    Right Hand Lateral Pinch 27 lbs    Right Hand 3 Point Pinch 26 lbs    Left Hand Grip (lbs) 113    Left Hand Lateral Pinch 28 lbs    Left 3 point pinch 21 lbs            Occupational Therapy Evaluation: Pt is a 56 y/o male post R posterior temporal lobe ICH.  Pt reports his main area of concern is his vision, but no deficits noted during assessment, see note for details.  Pt verbalizes that his disturbances are episodic when severe headaches come on.  Pt verbalized that he drove yesterday and had a period of blurred vision in his central vision and did remember a headache simultaneously.  OT advised to refrain from driving until pt has approval from MD at follow up appointments.  Pt agreed to avoid driving until then.  Memory and attention noted to be impaired by pt/spouse/and acute care clinicians.  OT reassessed cognition with Short Blessed Test, which he scored 4 (WNL for cognitive function), same score as scored in the hospital.  Pt had difficulty stating the months of the year in reverse order (2 errors and extra time required), and took 3 trials to immediately repeat a name, street address, and city following therapist's example, though score was still WNL and equal to acute care score, per chart.  Spouse states this is still not his baseline.  OT  recommended SLP eval, which pt and spouse declined at this time.  OT encouraged pt/spouse to revisit potential SLP eval during follow up with  PCP or neurologist if these deficits persist.  Pt is performing all basic self care with indep and without AD.  Spouse is currently managing medications d/t new meds and pt's forgetfulness.  See below for OT recommendations for compensatory memory strategies.  No additional skilled OT indicated at this time.  Pt and spouse both in agreement with poc.   Self Care: Instructed pt/spouse in compensatory memory strategies for medication management as spouse reports she is concerned about pt taking his blood pressure medication when she is at work, and currently has son stay with pt when she is gone to ensure meds are taken.  Recommendations made, including setting phone alarms for medication, use of written log for prn meds, use of pill organizer, and encouraged spouse call or text from work for a back up reminder.  Pt/spouse both receptive to all recommendations and are currently using a pill organizer.       OT Education - 09/04/21 1255     Education Details Role of OT, compensatory memory strategies for medication management    Person(s) Educated Patient;Spouse    Methods Explanation;Verbal cues    Comprehension Verbalized understanding;Verbal cues required              OT Short Term Goals - 09/04/21 1302       OT SHORT TERM GOAL #1   Title Pt/spouse will verbalize understanding of compensatory memory strategies for accurate medication administration.    Baseline Eval: recommendations provided at eval which pt/spouse are receptive to and verbalize understanding.    Time 1    Period Days    Status Achieved    Target Date 09/03/21               Plan - 09/04/21 1258     Clinical Impression Statement Pt is a 56 y/o male post R posterior temporal lobe ICH.  Pt reports his main area of concern is his vision, but no deficits noted during  assessment, see note for details.  Pt verbalizes that his disturbances are episodic when severe headaches come on.  Pt verbalized that he drove yesterday and had a period of blurred vision in his central vision and did remember a headache simultaneously.  OT advised to refrain from driving until pt has approval from MD at follow up appointments.  Pt agreed to avoid driving until then.  Memory and attention noted to be impaired by pt/spouse/and acute care clinicians.  OT reassessed cognition with Short Blessed Test, which he scored 4 (WNL for cognitive function), same score as scored in the hospital.  Pt had difficulty stating the months of the year in reverse order (2 errors and extra time required), and took 3 trials to immediately repeat a name, street address, and city following therapist's example, though score was still WNL and equal to acute care score, per chart.  Spouse states this is still not his baseline.  OT recommended SLP eval, which pt and spouse declined at this time.  OT encouraged pt/spouse to revisit potential SLP eval during follow up with PCP or neurologist if these deficits persist.  Pt is performing all basic self care with indep and without AD.  Spouse is currently managing medications d/t new meds and pt's forgetfulness.  See below for OT recommendations for compensatory memory strategies.  No additional skilled OT indicated at this time.  Pt and spouse both in agreement with poc.    OT Occupational Profile and History Problem Focused Assessment - Including review of records  relating to presenting problem    Occupational performance deficits (Please refer to evaluation for details): IADL's    Clinical Decision Making Limited treatment options, no task modification necessary    Comorbidities Affecting Occupational Performance: May have comorbidities impacting occupational performance    Modification or Assistance to Complete Evaluation  No modification of tasks or assist necessary to  complete eval    OT Frequency One time visit    OT Treatment/Interventions Self-care/ADL training;Patient/family education    Plan Eval only    OT Home Exercise Plan N/A    Recommended Other Services SLP evaluation recommended; pt/spouse decline at this time but receptive to discuss with neurologist on 10/18/21 at follow up if pt continues to struggle with memory deficits    Consulted and Agree with Plan of Care Patient;Family member/caregiver    Family Member Consulted Spouse, Tammy             Patient will benefit from skilled therapeutic intervention in order to improve the following deficits and impairments:           Visit Diagnosis: Vision disturbance  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 [place], [place], [address] Phone: [phone]   Fax:  [phone]  Name: ROMELL WOLDEN MRN: [id number] Date of Birth: 1966/07/30

## 2021-09-05 DIAGNOSIS — Z0000 Encounter for general adult medical examination without abnormal findings: Secondary | ICD-10-CM | POA: Insufficient documentation

## 2021-09-05 DIAGNOSIS — I10 Essential (primary) hypertension: Secondary | ICD-10-CM | POA: Insufficient documentation

## 2021-09-05 DIAGNOSIS — I680 Cerebral amyloid angiopathy: Secondary | ICD-10-CM | POA: Insufficient documentation

## 2021-09-05 DIAGNOSIS — R519 Headache, unspecified: Secondary | ICD-10-CM | POA: Insufficient documentation

## 2021-09-05 DIAGNOSIS — E854 Organ-limited amyloidosis: Secondary | ICD-10-CM | POA: Insufficient documentation

## 2021-10-21 ENCOUNTER — Ambulatory Visit: Payer: 59 | Admitting: Neurology

## 2021-10-21 ENCOUNTER — Encounter: Payer: Self-pay | Admitting: Neurology

## 2021-10-21 VITALS — BP 164/97 | HR 58 | Ht 74.0 in | Wt 222.0 lb

## 2021-10-21 DIAGNOSIS — I10 Essential (primary) hypertension: Secondary | ICD-10-CM

## 2021-10-21 DIAGNOSIS — I611 Nontraumatic intracerebral hemorrhage in hemisphere, cortical: Secondary | ICD-10-CM | POA: Diagnosis not present

## 2021-10-21 NOTE — Progress Notes (Signed)
[facility] [address] [place]. [place] [address] [phone]       OFFICE CONSULT NOTE  Jesus Warren Date of Birth:  [birth year] Medical Record Number:  [id number]   Referring MD: Leilani Able, NP  Reason for Referral: Intracerebral hemorrhage  HPI: Mr. Jesus Warren is a 56 year old Caucasian male seen today for initial office consultation visit for intracerebral hemorrhage.  History is obtained from the patient and his wife who is accompanying him as well as review of electronic medical records and I personally reviewed pertinent available imaging films in PACS.Mr. Jesus Warren is a 56 y.o. male with history of erectile dysfunction, hyperlipidemia, low testosterone who presents with 3 day history of headaches and since 0900 on 08/24/21, has had confusion with staggering gait, and slurred speech. Family called EMS and he was brought in as a stroke code. 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 Patient reports that he takes a significant amount of ibuprofen and acetaminophen for headaches. He used to drink alcohol everyday until christmas. Used to drink about 4 shots of vodka. Also sniffs tobacco daily because that gives him a nicotine high. Has been struggling with a lot of mental issues but took himself off the meds and has not followed up with his PCP since 2017. Wife states that she has noticed some memory issues in the the recent past. According to notes patient was confused and agitated in the ED. Patient reports headaches in the back of the head and takes acetaminophen and ibuprofen pretty consistently. He does not routinely take his blood pressure, but states that he has not been diagnosed with HTN. Neurological symptoms seem to have returned to baseline. He his oriented and speech is clear. PT has signed off on him. SLP cognitive exam noted some difficulty with memory and attention, which his wife had noticed at home as  well over the last 6 months. 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 Strict blood pressure management with scheduled medications, cleviprex, and PRNs used.  He was admitted to the ICU and blood pressure initially required Cleviprex for short period of time subsequently was switched to oral blood pressure medicines.  Neurological exam remained stable.  Follow-up scan showed stable appearance of the hematoma without any expansion.  His alcohol level was elevated at 296.  MR angiogram of the brain showed no intracranial stenosis or large vessel occlusion or aneurysm.  Carotid ultrasound showed no significant extracranial stenosis.  2D echo showed ejection fraction of 50 to 55%.  LDL cholesterol 95 mg percent.  Hemoglobin A1c was 5.4.  Urine drug screen was negative.  MRI scan cervical spine showed degenerative changes primarily at C5-6 left facet arthropathy at C7-T1 but no significant compression patient was counseled to quit smoking cigarettes and alcohol.  He had mild cognitive impairment for which she was seen by speech therapy and on SLUMS scale he scored 25/30.[name]  He was discharged home to the care of his wife with outpatient therapies.  Patient states is done well his initial therapies.  1 week after discharge he had a brief transient episode of left-sided peripheral vision loss that lasted only few minutes and resolved.  Occasionally sometimes he feels dizzy off and on and has to sit down for few minutes and that feeling is gone.  Continues to have short-term memory difficulties which are unchanged.  Patient history of frequent headaches in the past with this may have been related to his high blood pressure since his discharge his blood pressure is doing better and headaches have practically resolved.  He also  quit his job a year ago which was quite stressful which may also have helped his headaches.  He is made a full recovery from his speech difficulties though mild cognitive difficulties remain.  Interestingly he denies prior history of hypertension though his blood pressure has remained high despite now being on 3 different blood pressure medicines.  Today it is 164/97 in the office despite being on Zestril, hydralazine and Norvasc  ROS:   14 system review of systems is positive for vision difficulty, speech difficulties, memory loss, dizziness all other systems negative  PMH:  Past Medical History:  Diagnosis Date   Allergic rhinitis    Anxiety    Erectile dysfunction    Hyperlipidemia    Low testosterone    Umbilical hernia     Social History:  Social History   Socioeconomic History   Marital status: Married    Spouse name: Not on file   Number of children: Not on file   Years of education: Not on file   Highest education level: Not on file  Occupational History   Not on file  Tobacco Use   Smoking status: Unknown   Smokeless tobacco: Not on file  Substance and Sexual Activity   Alcohol use: Not on file   Drug use: Not on file   Sexual activity: Not on file  Other Topics Concern   Not on file  Social History Narrative   Not on file   Social Determinants of Health   Financial Resource Strain: Not on file  Food Insecurity: Not on file  Transportation Needs: Not on file  Physical Activity: Not on file  Stress: Not on file  Social Connections: Not on file  Intimate Partner Violence: Not on file    Medications:   Current Outpatient Medications on File Prior to Visit  Medication Sig Dispense Refill   acetaminophen (TYLENOL) 500 MG tablet Take 1,000 mg by mouth every 6 (six) hours as needed for moderate pain or headache.     amLODipine (NORVASC) 10 MG tablet Take 1 tablet (10 mg total) by mouth daily. 30 tablet 1   carvedilol (COREG) 6.25 MG tablet Take 6.25 mg by  mouth 2 (two) times daily.     hydrALAZINE (APRESOLINE) 50 MG tablet Take 1 tablet (50 mg total) by mouth every 8 (eight) hours. 90 tablet 1   lisinopril (ZESTRIL) 20 MG tablet Take 1 tablet (20 mg total) by mouth 2 (two) times daily. 60 tablet 1   No current facility-administered medications on file prior to visit.    Allergies:  No Known Allergies  Physical Exam General: well developed, well nourished middle-aged Caucasian male, seated, in no evident distress Head: head normocephalic and atraumatic.   Neck: supple with no carotid or supraclavicular bruits Cardiovascular: regular rate and rhythm, no murmurs Musculoskeletal: no deformity Skin:  no rash/petichiae Vascular:  Normal pulses all extremities  Neurologic Exam Mental Status: Awake and fully alert. Oriented to place and time. Recent and remote memory intact. Attention span, concentration and fund of knowledge appropriate. Mood and affect appropriate.  Diminished recall 1/3.  Able to name 15 animals which can walk on 4 legs.  Clock drawing 4/4. Cranial Nerves: Fundoscopic exam reveals sharp disc margins.  Pupils equal, briskly reactive to light. Extraocular movements full without nystagmus. Visual fields full to confrontation. Hearing intact. Facial sensation intact. Face, tongue, palate moves normally and symmetrically.  Motor: Normal bulk and tone. Normal strength in all tested extremity muscles. Sensory.: intact to touch , pinprick , position and vibratory sensation.  Coordination: Rapid alternating movements normal in all extremities. Finger-to-nose and heel-to-shin performed accurately bilaterally. Gait and Station: Arises from chair without difficulty. Stance is normal. Gait demonstrates normal stride length and balance . Able to heel, toe and tandem walk without difficulty.  Reflexes: 1+ and symmetric. Toes downgoing.   NIHSS  0 Modified Rankin  2   ASSESSMENT: 56 year old Caucasian male with right temporal lobar  intracerebral hemorrhage in January 2023 likely of hypertensive etiology given abnormal MRI showing multiple chronic microbleeds raising possibility of hypertensive small vessel disease rather than amyloid angiopathy given patient's young age or multiple cavernoma's.  Patient has been well from physical standpoint but does have mild cognitive impairment.     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  Amyloid angiopathy or multiple cavernous angiomas is possible though less likely.  I recommend aggressive blood pressure control with goal below 140/90.  He was advised to eliminate all external salt from the diet and maintain aggressive blood pressure control and follow-up with his primary care physician for the same.  Check diagnostic cerebral catheter angiogram to rule out any underlying hemangiomas.  Patient was also having mild cognitive impairment recommend increase participation in cognitively challenging activities like solving crossword puzzles, playing bridge and sudoku.  We also discussed memory compensation strategies.  Patient was also counseled to quit alcohol all limited to less than 1 or 2 drinks per day.  He will return for follow-up in the future in 3 months or call earlier if necessary.  Greater than 50% time during this 45-minute consultation visit was spent on counseling and coordination of care about his intracerebral hemorrhage and discussion of differential diagnosis and need to be compliant with his blood pressure medicines and quit alcohol and smoking. Antony Contras, MD  Note: This document was prepared with digital dictation and possible smart phrase technology. Any transcriptional errors that result from this process are unintentional.

## 2021-10-21 NOTE — Patient Instructions (Signed)
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  Amyloid angiopathy or multiple cavernous angiomas is possible though less likely.  I recommend aggressive blood pressure control with goal below 140/90.  He was advised to eliminate all external salt from the diet and maintain aggressive blood pressure control and follow-up with his primary care physician for the same.  Check diagnostic cerebral catheter angiogram to rule out any underlying hemangiomas.  Patient was also having mild cognitive impairment recommend increase participation in cognitively challenging activities like solving crossword puzzles, playing bridge and sudoku.  We also discussed memory compensation strategies.  He will return for follow-up in the future in 3 months or call earlier if necessary. ?Memory Compensation Strategies ? ?Use "WARM" strategy. ? W= write it down ? A= associate it ? R= repeat it ? M= make a mental note ? ?2.   You can keep a Social worker. ? Use a 3-ring notebook with sections for the following: calendar, important names and phone numbers,  medications, doctors' names/phone numbers, lists/reminders, and a section to journal what you did  each day.  ? ?3.    Use a calendar to write appointments down. ? ?4.    Write yourself a schedule for the day. ? This can be placed on the calendar or in a separate section of the Memory Notebook.  Keeping a  regular schedule can help memory. ? ?5.    Use medication organizer with sections for each day or morning/evening pills. ? You may need help loading it ? ?6.    Keep a basket, or pegboard by the door. ? Place items that you need to take out with you in the basket or on the pegboard.  You may also want to  include a message board for reminders. ? ?7.    Use sticky  notes. ? Place sticky notes with reminders in a place where the task is performed.  For example: " turn off the  stove" placed by the stove, "lock the door" placed on the door at eye level, " take your medications" on  the bathroom mirror or by the place where you normally take your medications. ? ?8.    Use alarms/timers. ? Use while cooking to remind yourself to check on food or as a reminder to take your medicine, or as a  reminder to make a call, or as a reminder to perform another task, etc. ? ?

## 2022-01-06 ENCOUNTER — Encounter: Payer: Self-pay | Admitting: Neurology

## 2022-01-27 ENCOUNTER — Telehealth (HOSPITAL_COMMUNITY): Payer: Self-pay

## 2022-01-27 ENCOUNTER — Ambulatory Visit: Payer: 59 | Admitting: Neurology

## 2022-01-27 NOTE — Telephone Encounter (Signed)
Ok per Dr. Debbrah Alar for a diagnostic angiogram. AW

## 2022-01-31 ENCOUNTER — Telehealth (HOSPITAL_COMMUNITY): Payer: Self-pay | Admitting: Radiology

## 2022-01-31 ENCOUNTER — Encounter (HOSPITAL_COMMUNITY): Payer: Self-pay

## 2022-01-31 NOTE — Telephone Encounter (Signed)
Called pt to let him know that his insurance denies his cerebral angiogram with Dr. Estanislado Pandy. Dr. Estanislado Pandy is out of his network and he does not have out of network benefits. He would like to see if Kathyrn Sheriff is in his network. He will not see Deveshwar. He will find a doctor in network to cerebral angiogram. I will send a message to [facility] to notify them of this change. JM

## 2022-02-03 ENCOUNTER — Telehealth (HOSPITAL_COMMUNITY): Payer: Self-pay

## 2022-02-03 NOTE — Telephone Encounter (Signed)
Called to inform patient that Jesus Warren found out that Dr. Kathyrn Sheriff is out of network as well. I have canceled his angiogram for tomorrow. He will need to call his insurance company to find out who is in network and possibly get a referral placed to them. The pt did not answer, left vm. AW

## 2022-02-04 ENCOUNTER — Ambulatory Visit (HOSPITAL_COMMUNITY): Admission: RE | Admit: 2022-02-04 | Payer: 59 | Source: Ambulatory Visit

## 2022-02-19 ENCOUNTER — Telehealth: Payer: Self-pay | Admitting: Neurology

## 2022-02-19 NOTE — Telephone Encounter (Signed)
Per Epic, the test was scheduled at [facility] but it was canceled due to the location being out of network. I called [facility] and spoke to [facility]. She is going to send a message back to the schedulers, Caryl Pina or Anderson Malta, to check options for next step.  However, I think the best approach is for the patient to contact his insurance company to get the name of a facility that would be in-network. Once he has this information, we can fax a new order to the covered location. He verbalized understanding.

## 2022-02-19 NOTE — Telephone Encounter (Signed)
Patient requesting call back from nurse Sharyn Lull stating that he called the numbers provided by her in the [facility]. Apparently they do not do the testing. He wants to know what he should do. Best call back is [phone]

## 2022-02-27 ENCOUNTER — Telehealth: Payer: Self-pay | Admitting: Neurology

## 2022-02-27 NOTE — Telephone Encounter (Signed)
Sharyn Lull I see you have been speaking to this patient about the angiogram in previous notes

## 2022-02-27 NOTE — Telephone Encounter (Signed)
I called pt. No answer, left a message asking pt to call me back.   

## 2022-02-27 NOTE — Telephone Encounter (Signed)
Pt's wife returned my call. She sts they have been in contact with his insurance and the  preferred location for the Angiogram would be with [facility].  I advised I would fwd to our coordinator for scheduling. Of note, the pt has Exp date of insurance as : 04/16/2022

## 2022-02-27 NOTE — Telephone Encounter (Signed)
Wife is asking for a call re: the scheduling of the Angiogram for pt

## 2022-03-03 ENCOUNTER — Other Ambulatory Visit: Payer: Self-pay | Admitting: Neurology

## 2022-03-03 DIAGNOSIS — Q283 Other malformations of cerebral vessels: Secondary | ICD-10-CM

## 2022-03-03 NOTE — Telephone Encounter (Signed)
He will need a referral to [facility] for the test Dr. Leonie Man is wanting done

## 2022-03-04 NOTE — Telephone Encounter (Signed)
I sent referral: [facility] for diagnostic cerebral catheter angiogram for h/o ICH to rule out brain AVM  Department Of Radiology [address] [place], [place] [address] [phone]

## 2022-03-11 NOTE — Telephone Encounter (Signed)
Patient's wife left me a voicemail asking for an update on this. I returned her call and left a message with the info listed below to call [facility].

## 2022-03-13 NOTE — Telephone Encounter (Signed)
Their insurance company will not cover this test. Are there other options for this patient? I have called [employer] and they said that [facility] as a facility is covered, but they will not cover the actual provider who will do the test and the patient can't pay out of pocket. The providers the insurance gave me do not actually do this angiogram and the patient was incorrect in thinking [facility] was covered. I am at a dead end with how to help them. I have been working with Gildardo Pounds in [facility] interventional radiology scheduling and we don't know what to do. Are you able to speak with this patient and come up with an alternative? Their current insurance expires on August 31.

## 2022-03-14 ENCOUNTER — Other Ambulatory Visit: Payer: Self-pay | Admitting: Neurology

## 2022-03-14 DIAGNOSIS — I639 Cerebral infarction, unspecified: Secondary | ICD-10-CM

## 2022-03-17 NOTE — Telephone Encounter (Signed)
I spoke to patient. He is agreeable to move forward with the CTA Head (orders in Epic).

## 2022-03-17 NOTE — Telephone Encounter (Signed)
I am waiting on auth approval from [employer]

## 2022-03-17 NOTE — Telephone Encounter (Signed)
Are you able to explain the difference to the patient? Between the CT angio and MRI. When it comes to scheduling I don't know the time frame since that is up to the hospital.

## 2022-03-17 NOTE — Telephone Encounter (Signed)
Pt is calling and requesting Sharyn Lull call him back.

## 2022-03-17 NOTE — Telephone Encounter (Signed)
Sent Dr. Clydene Fake message to the patient.

## 2022-03-19 NOTE — Telephone Encounter (Signed)
[employer] auth: [id number] exp. 03/19/22-06/19/22 sent to [facility]

## 2022-03-24 ENCOUNTER — Ambulatory Visit
Admission: RE | Admit: 2022-03-24 | Discharge: 2022-03-24 | Disposition: A | Discharge status: Home / Self Care | Payer: 59 | Source: Ambulatory Visit | Attending: Neurology | Admitting: Neurology

## 2022-03-24 DIAGNOSIS — I639 Cerebral infarction, unspecified: Secondary | ICD-10-CM | POA: Diagnosis present

## 2022-03-24 MED ORDER — IOHEXOL 350 MG/ML SOLN
75.0000 mL | Freq: Once | INTRAVENOUS | Status: AC | PRN
  Administered 2022-03-24: 75 mL via INTRAVENOUS

## 2022-03-26 DIAGNOSIS — Z0271 Encounter for disability determination: Secondary | ICD-10-CM

## 2022-04-02 ENCOUNTER — Telehealth: Payer: Self-pay | Admitting: Neurology

## 2022-04-02 NOTE — Telephone Encounter (Signed)
Wife is asking for a call re: what the plan of action is based on results of pt's Angiogram, please call.

## 2022-05-26 DIAGNOSIS — R69 Illness, unspecified: Secondary | ICD-10-CM | POA: Diagnosis not present

## 2022-06-16 DIAGNOSIS — Z2821 Immunization not carried out because of patient refusal: Secondary | ICD-10-CM | POA: Diagnosis not present

## 2022-06-16 DIAGNOSIS — R55 Syncope and collapse: Secondary | ICD-10-CM | POA: Diagnosis not present

## 2022-06-16 DIAGNOSIS — E854 Organ-limited amyloidosis: Secondary | ICD-10-CM | POA: Diagnosis not present

## 2022-06-16 DIAGNOSIS — R69 Illness, unspecified: Secondary | ICD-10-CM | POA: Diagnosis not present

## 2022-06-16 DIAGNOSIS — I680 Cerebral amyloid angiopathy: Secondary | ICD-10-CM | POA: Diagnosis not present

## 2022-06-16 DIAGNOSIS — I10 Essential (primary) hypertension: Secondary | ICD-10-CM | POA: Diagnosis not present

## 2022-07-01 ENCOUNTER — Telehealth: Payer: Self-pay | Admitting: Neurology

## 2022-07-01 NOTE — Telephone Encounter (Signed)
Pt is asking for a call from RN to discuss if the upcoming appointment is really needed.

## 2022-07-01 NOTE — Telephone Encounter (Signed)
Message from RN was relayed to pt on vm(okayed by DPR) this is FYI for POD 2

## 2022-07-14 NOTE — Telephone Encounter (Signed)
Patient called in again to discuss his f/u with Dr. Leonie Man tomorrow. He asked me why the follow up was scheduled as he did not understand why he needed to follow up with [place] as all of his imaging has been normal so far. I stated he cancelled that appt was last week, so a f/u was no longer scheduled but it looks like it was just a routine follow up to check in not anything specific we would have been doing other than discussion of previous symptoms he was having. Patient started arguing saying he's had many issues with this office getting imaging scheduled and he doesn't understand why someone made him an appt if Verlin Grills messaged him and said all his testing was normal. I again stated that the follow up appointment was just to check in, and Verlin Grills RN had advised a couple of weeks ago to keep this appointment, but if he does not wish to follow up here he certainly does not have to and again, it looks like that appointment was already cancelled. He then started to discuss previous messages with Lupe Carney and issues with getting imaging scheduled and the phone was disconnected-I believe my headset died, tried to call patient back, but phone went straight to voicemail and was unable to leave a voicemail. If patient calls back can be transferred to me if needed. He mainly just wanted to know why the follow up was scheduled and did not want to be billed for it. I did advise he was not going to be billed for an appointment he cancelled 7 days before the appointment date.

## 2022-07-15 ENCOUNTER — Ambulatory Visit: Payer: Self-pay | Admitting: Neurology

## 2022-07-24 ENCOUNTER — Telehealth: Payer: Self-pay

## 2022-07-24 NOTE — Telephone Encounter (Signed)
mr Jesus Warren left another nasty message about he is not getting call back he was left a message last time that he had been called back and that messages were left on his phone with the dates and times her called.

## 2022-07-24 NOTE — Telephone Encounter (Signed)
called patient back he blessed me out that when he call the main number he hits 1 and it will not go anywhere he can not talk to anyone. he stated that he left message on that line but there is no way to leave a message on that line. i told him he left a with my line and that I have called him back. He asked about his referral and I told him that it was closed because we called him several time with no return call back. (Which since I have been doing referrals now when I leave a message I leave my phone direct number [phone] for them to return my call )  I again gave him the dates along with the times I left the messages.   He wants management name and email address. He wants his appointment emailed to him and he like for it to be done today.  I did send a notice to my supervisors and asked them to contact him ASAP.

## 2022-07-25 ENCOUNTER — Telehealth: Payer: Self-pay

## 2022-07-25 NOTE — Telephone Encounter (Signed)
Appointment - Telephone call with patient to follow up on his concerns he had been leaving messages about referrals and was not getting called back. Patient was upset our phone lines were not routing him correctly for assistance and stated this has been going on for at least a few months.  Apologized to Jesus Warren and informed this issue was being worked on as our Academic librarian are currently working on the issue to ensure all lines are going to the correct desks once a number is selected from the main [employer].  Jesus Warren stated his concerns in case someone was in an emergent need and shared this concern as he agreed to call back and provided an alternative line choice for now until correct.  Patient understanding and is scheduled for a first visit on 09/23/22.

## 2022-07-25 NOTE — Telephone Encounter (Signed)
Mr Nickless called again and left another message about how he is recording everthing and he was going to call channel [employer] news.

## 2022-09-01 ENCOUNTER — Ambulatory Visit (INDEPENDENT_AMBULATORY_CARE_PROVIDER_SITE_OTHER): Payer: 59 | Admitting: Psychiatry

## 2022-09-01 ENCOUNTER — Encounter: Payer: Self-pay | Admitting: Psychiatry

## 2022-09-01 VITALS — BP 134/79 | HR 68 | Temp 97.8°F | Ht 74.0 in | Wt 229.6 lb

## 2022-09-01 DIAGNOSIS — F325 Major depressive disorder, single episode, in full remission: Secondary | ICD-10-CM | POA: Diagnosis not present
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
## 2022-09-23 ENCOUNTER — Ambulatory Visit: Payer: Self-pay | Admitting: Psychiatry
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
## 2022-10-14 ENCOUNTER — Ambulatory Visit (INDEPENDENT_AMBULATORY_CARE_PROVIDER_SITE_OTHER): Payer: 59 | Admitting: Psychiatry

## 2022-10-14 ENCOUNTER — Encounter: Payer: Self-pay | Admitting: Psychiatry

## 2022-10-14 VITALS — BP 160/94 | HR 84 | Temp 97.3°F | Ht 74.0 in | Wt 226.8 lb

## 2022-10-14 DIAGNOSIS — G4700 Insomnia, unspecified: Secondary | ICD-10-CM

## 2022-10-14 DIAGNOSIS — F325 Major depressive disorder, single episode, in full remission: Secondary | ICD-10-CM

## 2022-10-14 NOTE — Patient Instructions (Signed)
Continue lexapro 20 mg daily   Obtain EKG Start trazodone 25-50 mg at night as needed for insomnia after reviewing EKG Next appointment: 5/13 at 10 AM

## 2022-10-29 ENCOUNTER — Telehealth: Payer: Self-pay

## 2022-10-29 DIAGNOSIS — F325 Major depressive disorder, single episode, in full remission: Secondary | ICD-10-CM

## 2022-10-29 MED ORDER — ESCITALOPRAM OXALATE 20 MG PO TABS: 20.0000 mg | ORAL_TABLET | Freq: Every day | ORAL | 0 refills | Status: DC

## 2022-10-29 NOTE — Telephone Encounter (Signed)
I have sent a 30-day supply of Lexapro 20 mg to [facility], [address]  Will defer to Dr. Modesta Messing for further refills.

## 2022-10-29 NOTE — Telephone Encounter (Signed)
Please verify the patient what pharmacy he wants Lexapro sent out to since as per review of chart patient declined a refill for Lexapro when Dr. Modesta Messing offered it.  Hence I am not sure what pharmacy he wants his medications sent out to.  Please let me know.

## 2022-10-29 NOTE — Telephone Encounter (Signed)
spoke with patient he states that he wants rx to [facility] to [facility] on [address]

## 2022-10-29 NOTE — Telephone Encounter (Signed)
pt left a message that he needed refills on escitalopram. pt last seen on 10-14-22 next appt 12-29-22

## 2022-10-30 NOTE — Telephone Encounter (Signed)
Pt notified by phone.

## 2022-11-01 ENCOUNTER — Other Ambulatory Visit: Payer: Self-pay | Admitting: Psychiatry

## 2022-11-01 DIAGNOSIS — F325 Major depressive disorder, single episode, in full remission: Secondary | ICD-10-CM

## 2022-11-01 MED ORDER — ESCITALOPRAM OXALATE 20 MG PO TABS: 20.0000 mg | ORAL_TABLET | Freq: Every day | ORAL | 1 refills | Status: AC

## 2022-11-06 ENCOUNTER — Telehealth: Payer: Self-pay

## 2022-11-06 NOTE — Telephone Encounter (Signed)
left message that he needed to have a EKG done before medication can be sent to the pharmacy. I requested that he call our office back.

## 2022-11-06 NOTE — Telephone Encounter (Signed)
pt left a message that he need a refill on his sleep medication. [facility] [address]   Pt last seen on 10-14-22 next appt 12-29-22.

## 2022-11-06 NOTE — Telephone Encounter (Signed)
It wasn't prescribed as he hasn't undergone the recommended EKG. Please advise him to obtain an EKG. If the results are good, we can plan to order Trazodone.
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
## 2022-12-29 ENCOUNTER — Encounter: Payer: Self-pay | Admitting: Psychiatry

## 2022-12-29 ENCOUNTER — Ambulatory Visit: Payer: 59 | Admitting: Psychiatry

## 2022-12-29 VITALS — BP 153/91 | HR 65 | Temp 97.4°F | Ht 74.0 in | Wt 220.4 lb

## 2022-12-29 DIAGNOSIS — F325 Major depressive disorder, single episode, in full remission: Secondary | ICD-10-CM

## 2022-12-29 DIAGNOSIS — G4700 Insomnia, unspecified: Secondary | ICD-10-CM
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
## 2023-03-09 ENCOUNTER — Encounter: Payer: Self-pay | Admitting: Psychiatry

## 2023-03-09 ENCOUNTER — Ambulatory Visit (INDEPENDENT_AMBULATORY_CARE_PROVIDER_SITE_OTHER): Payer: 59 | Admitting: Psychiatry

## 2023-03-09 VITALS — BP 145/91 | HR 61 | Temp 97.8°F | Ht 74.0 in | Wt 219.0 lb

## 2023-03-09 DIAGNOSIS — G4700 Insomnia, unspecified: Secondary | ICD-10-CM

## 2023-03-09 DIAGNOSIS — F325 Major depressive disorder, single episode, in full remission: Secondary | ICD-10-CM

## 2023-05-23 ENCOUNTER — Ambulatory Visit: Payer: Self-pay

## 2023-05-23 ENCOUNTER — Encounter: Payer: Self-pay | Admitting: Emergency Medicine

## 2023-05-23 ENCOUNTER — Other Ambulatory Visit: Payer: Self-pay

## 2023-05-23 ENCOUNTER — Ambulatory Visit
Admission: EM | Admit: 2023-05-23 | Discharge: 2023-05-23 | Disposition: A | Discharge status: Home / Self Care | Payer: 59 | Attending: Emergency Medicine | Admitting: Emergency Medicine

## 2023-05-23 DIAGNOSIS — S61311A Laceration without foreign body of left index finger with damage to nail, initial encounter: Secondary | ICD-10-CM

## 2023-05-23 MED ORDER — TRAMADOL HCL 50 MG PO TABS: 50.0000 mg | ORAL_TABLET | Freq: Four times a day (QID) | ORAL | 0 refills | Status: AC | PRN

## 2023-05-23 MED ORDER — CEPHALEXIN 500 MG PO CAPS: 500.0000 mg | ORAL_CAPSULE | Freq: Three times a day (TID) | ORAL | 0 refills | Status: AC

## 2023-05-23 NOTE — ED Notes (Signed)
Dressing applied to 2nd digit left hand per provider.

## 2023-05-23 NOTE — ED Notes (Signed)
Dressing removed. Part of tip of nail left 2nd digit missing.  Active bleeding after removing dressing despite soaking. Saline soaked gauze and coban applied until seen by NP

## 2023-05-23 NOTE — ED Notes (Signed)
Finger soaking in sterile water and hibiclens.

## 2023-05-23 NOTE — Discharge Instructions (Addendum)
Take the cephalexin as directed.    Take Tylenol or ibuprofen as needed for discomfort.  If your pain is not controlled, take the tramadol as directed.  Do not drive, operate machinery, drink alcohol, or perform dangerous activities while taking this medication as it may cause drowsiness.  Keep your wound clean and dry.  Wash it gently twice a day with soap and water.  Apply an antibiotic ointment and bandage twice a day.    Follow-up right away if you see signs of infection, such as redness, pus-like drainage, fever, or other concerning symptoms.

## 2023-05-23 NOTE — ED Triage Notes (Signed)
Pt cut left 2nd digit on Wednesday with kitchen knife.  Last TDAP 3 yrs ago.  Unable to visualize wound r/t gauze dried and stuck to it.

## 2023-05-23 NOTE — ED Provider Notes (Signed)
Jesus Warren    CSN: [id number] Arrival date & time: 05/23/23  1320      History   Chief Complaint Chief Complaint  Patient presents with   Laceration    HPI Jesus Warren is a 57 y.o. male.  Patient presents with a bleeding wound on his left index finger.  The wound occurred on 05/20/2023 when he accidentally cut the end of his finger and part of his fingernail off while slicing vegetables.  He applied a bandage to it at that time but has not changed the bandage since and the bandage is stuck to the wound.  No numbness, weakness, fever, purulent drainage, or other symptoms.  Last tetanus 3 years ago.  The history is provided by the patient, the spouse and medical records.    Past Medical History:  Diagnosis Date   Allergic rhinitis    Anxiety    Erectile dysfunction    Hyperlipidemia    Low testosterone    Umbilical hernia     Patient Active Problem List   Diagnosis Date Noted   Cerebral amyloid angiopathy (HCC) 09/05/2021   Essential hypertension 09/05/2021   Headache disorder 09/05/2021   Health care maintenance 09/05/2021   ICH (intracerebral hemorrhage) (HCC) 08/24/2021    Past Surgical History:  Procedure Laterality Date   COLONOSCOPY WITH PROPOFOL N/A 08/05/2016   Procedure: COLONOSCOPY WITH PROPOFOL;  Surgeon: Christena Deem, MD;  Location: [facility] ENDOSCOPY;  Service: Endoscopy;  Laterality: N/A;   VASECTOMY         Home Medications    Prior to Admission medications   Medication Sig Start Date End Date Taking? Authorizing Provider  cephALEXin (KEFLEX) 500 MG capsule Take 1 capsule (500 mg total) by mouth 3 (three) times daily. 05/23/23  Yes Mickie Bail, NP  traMADol (ULTRAM) 50 MG tablet Take 1 tablet (50 mg total) by mouth every 6 (six) hours as needed. 05/23/23  Yes Mickie Bail, NP  amLODipine (NORVASC) 5 MG tablet Take 5 mg by mouth in the morning. 01/19/22   [provider]  carvedilol (COREG) 25 MG tablet Take 25 mg by mouth 2  (two) times daily. (0800 & 2000) 12/11/21   [provider]  escitalopram (LEXAPRO) 20 MG tablet Take 1 tablet (20 mg total) by mouth daily. 11/28/22 01/27/23  Neysa Hotter, MD  hydrALAZINE (APRESOLINE) 50 MG tablet Take 1 tablet (50 mg total) by mouth every 8 (eight) hours. 08/27/21   Elmer Picker, NP  lisinopril (ZESTRIL) 20 MG tablet Take 1 tablet (20 mg total) by mouth 2 (two) times daily. 08/27/21   Elmer Picker, NP    Family History History reviewed. No pertinent family history.  Social History Social History   Tobacco Use   Smoking status: Never    Passive exposure: Never   Smokeless tobacco: Never  Vaping Use   Vaping status: Never Used  Substance Use Topics   Alcohol use: Never   Drug use: Never     Allergies   Patient has no known allergies.   Review of Systems Review of Systems  Constitutional:  Negative for chills and fever.  Musculoskeletal:  Negative for arthralgias and joint swelling.  Skin:  Positive for wound. Negative for color change.  Neurological:  Negative for weakness and numbness.     Physical Exam Triage Vital Signs ED Triage Vitals  Encounter Vitals Group     BP      Systolic BP Percentile      Diastolic  BP Percentile      Pulse      Resp      Temp      Temp src      SpO2      Weight      Height      Head Circumference      Peak Flow      Pain Score      Pain Loc      Pain Education      Exclude from Growth Chart    No data found.  Updated Vital Signs BP (!) 143/99   Pulse 74   Temp 98.7 F (37.1 C)   Resp 18   Ht 6\' 2"  (1.88 m)   Wt 220 lb (99.8 kg)   SpO2 99%   BMI 28.25 kg/m   Visual Acuity Right Eye Distance:   Left Eye Distance:   Bilateral Distance:    Right Eye Near:   Left Eye Near:    Bilateral Near:     Physical Exam Vitals and nursing note reviewed.  Constitutional:      General: He is not in acute distress.    Appearance: He is well-developed.  HENT:     Mouth/Throat:     Mouth: Mucous  membranes are moist.  Cardiovascular:     Rate and Rhythm: Normal rate and regular rhythm.  Pulmonary:     Effort: Pulmonary effort is normal. No respiratory distress.  Musculoskeletal:        General: No deformity. Normal range of motion.     Cervical back: Neck supple.  Skin:    General: Skin is warm and dry.     Capillary Refill: Capillary refill takes less than 2 seconds.     Findings: Lesion present.     Comments: Avulsion laceration on tip of left index finger with partial nail avulsion.  After bandage soaked and removed, wound is bleeding but controlled with direct pressure.  Neurological:     Mental Status: He is alert.     Sensory: No sensory deficit.     Motor: No weakness.  Psychiatric:        Mood and Affect: Mood normal.        Behavior: Behavior normal.      UC Treatments / Results  Labs (all labs ordered are listed, but only abnormal results are displayed) Labs Reviewed - No data to display  EKG   Radiology No results found.  Procedures Laceration Repair  Date/Time: 05/23/2023 2:50 PM  Performed by: Mickie Bail, NP Authorized by: Mickie Bail, NP   Consent:    Consent obtained:  Verbal   Consent given by:  Patient   Risks discussed:  Infection, pain, poor cosmetic result and poor wound healing Universal protocol:    Procedure explained and questions answered to patient or proxy's satisfaction: yes   Anesthesia:    Anesthesia method:  Nerve block   Block needle gauge:  27 G   Block anesthetic:  Lidocaine 1% w/o epi   Block injection procedure:  Anatomic landmarks identified, introduced needle, incremental injection, negative aspiration for blood and anatomic landmarks palpated   Block outcome:  Anesthesia achieved Laceration details:    Location:  Finger   Finger location:  L index finger Pre-procedure details:    Preparation:  Patient was prepped and draped in usual sterile fashion Exploration:    Hemostasis achieved with:  Direct pressure,  tourniquet and cautery   Imaging outcome: foreign body not  noted     Wound exploration: wound explored through full range of motion and entire depth of wound visualized   Treatment:    Area cleansed with:  Shur-Clens Repair type:    Repair type:  Simple Post-procedure details:    Dressing:  Antibiotic ointment and non-adherent dressing   Procedure completion:  Tolerated well, no immediate complications Comments:     Wound cauterized with silver nitrate sticks.  (including critical care time)  Medications Ordered in UC Medications - No data to display  Initial Impression / Assessment and Plan / UC Course  I have reviewed the triage vital signs and the nursing notes.  Pertinent labs & imaging results that were available during my care of the patient were reviewed by me and considered in my medical decision making (see chart for details).    Laceration of left index finger with damage to the nail.  Nerve block and then cauterized with silver nitrate sticks.  Antibiotic ointment and dressing applied.  Patient is up-to-date on his tetanus.  Treating with cephalexin.  Tylenol or ibuprofen as needed for discomfort.  If pain is not controlled, prescribed tramadol and precautions for drowsiness discussed.  Instructed patient to follow-up right away if he notes signs of infection.  Education provided on laceration care.  Patient agrees to plan of care.  Final Clinical Impressions(s) / UC Diagnoses   Final diagnoses:  Laceration of left index finger without foreign body with damage to nail, initial encounter     Discharge Instructions      Take the cephalexin as directed.    Take Tylenol or ibuprofen as needed for discomfort.  If your pain is not controlled, take the tramadol as directed.  Do not drive, operate machinery, drink alcohol, or perform dangerous activities while taking this medication as it may cause drowsiness.  Keep your wound clean and dry.  Wash it gently twice a day with  soap and water.  Apply an antibiotic ointment and bandage twice a day.    Follow-up right away if you see signs of infection, such as redness, pus-like drainage, fever, or other concerning symptoms.        ED Prescriptions     Medication Sig Dispense Auth. Provider   cephALEXin (KEFLEX) 500 MG capsule Take 1 capsule (500 mg total) by mouth 3 (three) times daily. 21 capsule Mickie Bail, NP   traMADol (ULTRAM) 50 MG tablet Take 1 tablet (50 mg total) by mouth every 6 (six) hours as needed. 15 tablet Mickie Bail, NP      I have reviewed the PDMP during this encounter.   Mickie Bail, NP 05/23/23 1525
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
## 2023-06-01 ENCOUNTER — Ambulatory Visit: Payer: 59 | Admitting: Psychiatry

## 2024-05-16 ENCOUNTER — Ambulatory Visit: Payer: Self-pay

## 2024-05-16 DIAGNOSIS — Z860100 Personal history of colon polyps, unspecified: Secondary | ICD-10-CM | POA: Diagnosis not present

## 2024-05-16 DIAGNOSIS — Z09 Encounter for follow-up examination after completed treatment for conditions other than malignant neoplasm: Secondary | ICD-10-CM | POA: Diagnosis present

## 2024-07-29 ENCOUNTER — Other Ambulatory Visit: Payer: Self-pay | Admitting: Neurology

## 2024-07-29 DIAGNOSIS — I680 Cerebral amyloid angiopathy: Secondary | ICD-10-CM

## 2024-08-29 ENCOUNTER — Ambulatory Visit
Admission: RE | Admit: 2024-08-29 | Discharge: 2024-08-29 | Disposition: A | Discharge status: Home / Self Care | Source: Ambulatory Visit | Attending: Neurology | Admitting: Neurology

## 2024-08-29 DIAGNOSIS — I680 Cerebral amyloid angiopathy: Secondary | ICD-10-CM | POA: Insufficient documentation

## 2024-08-29 DIAGNOSIS — E854 Organ-limited amyloidosis: Secondary | ICD-10-CM | POA: Diagnosis present
